# Patient Record
Sex: Male | Born: 1945
Health system: Southern US, Community
[De-identification: ages and names within clinical notes are randomized; demographics above are authoritative.]

## PROBLEM LIST (undated history)

## (undated) DIAGNOSIS — F32A Depression, unspecified: Secondary | ICD-10-CM

## (undated) DIAGNOSIS — K219 Gastro-esophageal reflux disease without esophagitis: Secondary | ICD-10-CM

## (undated) HISTORY — PX: NASAL SEPTUM SURGERY: SHX37

---

## 2000-06-17 ENCOUNTER — Encounter: Admission: RE | Admit: 2000-06-17 | Discharge: 2000-06-17 | Payer: Self-pay | Admitting: Sports Medicine

## 2005-07-28 ENCOUNTER — Ambulatory Visit (HOSPITAL_COMMUNITY): Admission: RE | Admit: 2005-07-28 | Discharge: 2005-07-28 | Payer: Self-pay | Admitting: Gastroenterology

## 2005-07-28 ENCOUNTER — Encounter (INDEPENDENT_AMBULATORY_CARE_PROVIDER_SITE_OTHER): Payer: Self-pay | Admitting: *Deleted

## 2007-10-21 HISTORY — PX: MENISCUS REPAIR: SHX5179

## 2007-11-02 ENCOUNTER — Ambulatory Visit: Payer: Self-pay | Admitting: Sports Medicine

## 2007-11-02 DIAGNOSIS — M25569 Pain in unspecified knee: Secondary | ICD-10-CM | POA: Insufficient documentation

## 2007-11-30 ENCOUNTER — Encounter: Payer: Self-pay | Admitting: Sports Medicine

## 2010-03-07 ENCOUNTER — Emergency Department (HOSPITAL_COMMUNITY): Admission: EM | Admit: 2010-03-07 | Discharge: 2010-03-07 | Payer: Self-pay | Admitting: Emergency Medicine

## 2010-03-13 ENCOUNTER — Encounter: Admission: RE | Admit: 2010-03-13 | Discharge: 2010-03-13 | Payer: Self-pay | Admitting: Allergy and Immunology

## 2011-01-06 LAB — URINE CULTURE: Colony Count: NO GROWTH

## 2011-01-06 LAB — CBC
HCT: 43.4 % (ref 39.0–52.0)
MCV: 92.8 fL (ref 78.0–100.0)

## 2011-01-06 LAB — COMPREHENSIVE METABOLIC PANEL
Albumin: 3.9 g/dL (ref 3.5–5.2)
Chloride: 108 mEq/L (ref 96–112)
Creatinine, Ser: 0.8 mg/dL (ref 0.4–1.5)
GFR calc Af Amer: >60 mL/min (ref >60)
GFR calc non Af Amer: >60 mL/min (ref >60)
Potassium: 4 mEq/L (ref 3.5–5.1)
Total Bilirubin: 1 mg/dL (ref 0.3–1.2)

## 2011-01-06 LAB — URINALYSIS, ROUTINE W REFLEX MICROSCOPIC
Glucose, UA: NEGATIVE mg/dL
Ketones, ur: 15 mg/dL — AB
Nitrite: NEGATIVE
Urobilinogen, UA: 0.2 mg/dL (ref 0.0–1.0)
pH: 8 (ref 5.0–8.0)

## 2011-01-06 LAB — POCT CARDIAC MARKERS
CKMB, poc: 1.2 ng/mL (ref 1.0–8.0)
Myoglobin, poc: 73.9 ng/mL (ref 12–200)
Troponin i, poc: <0.05 ng/mL (ref 0.00–0.09)

## 2011-01-06 LAB — DIFFERENTIAL
Basophils Absolute: 0 10*3/uL (ref 0.0–0.1)
Lymphs Abs: 0.6 10*3/uL — ABNORMAL LOW (ref 0.7–4.0)
Monocytes Absolute: 0.2 10*3/uL (ref 0.1–1.0)
Monocytes Relative: 2 % — ABNORMAL LOW (ref 3–12)
Neutro Abs: 8.7 10*3/uL — ABNORMAL HIGH (ref 1.7–7.7)

## 2011-01-06 LAB — URINE MICROSCOPIC-ADD ON

## 2011-03-07 NOTE — Op Note (Signed)
NAMEARDITH, LEWMAN          ACCOUNT NO.:  [id number]   MEDICAL RECORD NO.:  [id number]          PATIENT TYPE:  AMB   LOCATION:  ENDO                         FACILITY:  [facility]   PHYSICIAN:  Petra Kuba, M.D.    DATE OF BIRTH:  [birth year]   DATE OF PROCEDURE:  07/28/2005  DATE OF DISCHARGE:                                 OPERATIVE REPORT   PROCEDURE:  Colonoscopy.   INDICATIONS:  Screening. Consent was signed after risks, benefits, methods,  options thoroughly discussed with my nurse in the office.   MEDICINES USED:  Fentanyl 75, Versed 7.5.   PROCEDURE:  Rectal inspection is pertinent for external hemorrhoids, small  digital exam was negative. The pediatric adjustable colonoscope was inserted  and easily advanced around the colon to the cecum. On insertion in the mid  transverse, a small polyp seen and was hot biopsied times one.  Another one  in the hepatic flexure was seen and hot biopsied x one and they were put in  the same container. No other abnormalities were seen as we advanced to the  cecum. No position changes or abdominal pressure was needed. Scope was  inserted a short ways in the terminal ileum which was normal .  Photodocumentation was obtained. Scope was slowly withdrawn. Prep was  adequate. There was some liquid stool that required washing and suction on  slow withdrawal through the colon.  The two polyps seen on insertion may  have had a little bit of residual polypoid tissue and a second hot biopsy  was done.  Two other transverse polyps were seen, tiny and hot biopsied x  one or two and put in the same container.  As scope was withdrawn around the  left side of the colon, two tiny descending and sigmoid polyps were seen and  hot biopsied and one smaller one was seen, snared electrocautery applied and  polyp suctioned through the scope and collected in the trap. The three left-  sided colon polyps were put in the separate container. No other  abnormalities were seen as we slowly withdrew back to the rectum.  Anorectal  pull-through and retroflexion confirmed some small hemorrhoids. Scope  straightened readvanced a short ways up the left side of the colon. Air was  suctioned, scope removed. The patient tolerated the procedure well. There  was no obvious immediate complication.   ENDOSCOPIC DIAGNOSIS:  1.  Internal-external small hemorrhoids.  2.  One small snare in the sigmoid.  3.  Six hot biopsies, one in the sigmoid and one in the descending, one in      the hepatic flexure, and three in the transverse.  4.  Otherwise within normal limits to the terminal ileum.   PLAN:  Await pathology to determine future colonic screening. Happy to see  back p.r.n. Otherwise return care to Dr. Janey Greaser for the customary health  care maintenance to include yearly rectals and guaiacs.           ______________________________  Petra Kuba, M.D.     MEM/MEDQ  D:  07/28/2005  T:  07/28/2005  Job:  [id number]   cc:  Al Decant. Janey Greaser, MD  Fax: [phone]

## 2011-07-02 IMAGING — CT CT ABD-PELV W/ CM
1 of 3 series · 14 of 32 positions shown, 19 images · IV contrast (agent unspecified)
Comparison: None.

CLINICAL DATA: Abdominal pain, nausea, chills

CT ABDOMEN AND PELVIS WITH CONTRAST
TECHNIQUE: Multidetector CT imaging of the abdomen and pelvis was
performed following the standard protocol during bolus
administration of intravenous contrast.
Contrast: 100 ml Pmnipaque-WDD

[Series 2: rtn ap with st · axial · 0.74mm/px · z∈[-468,-43]mm · 14 of 96 slices shown, 19 images]
[im 6/96  soft-tissue]
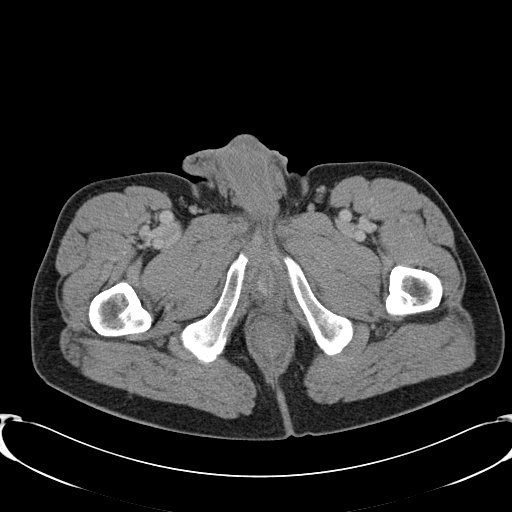
[im 6/96  bone]
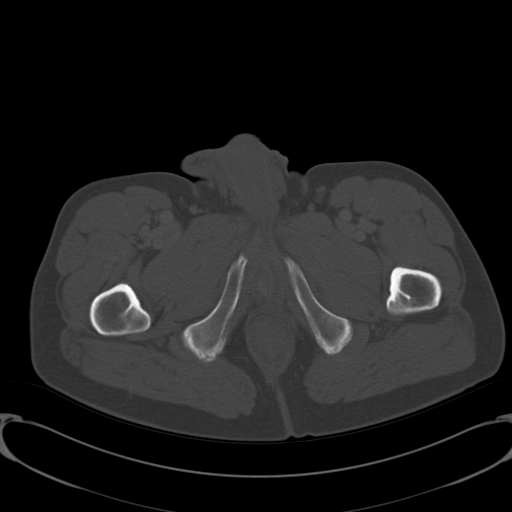
[im 16/96  soft-tissue]
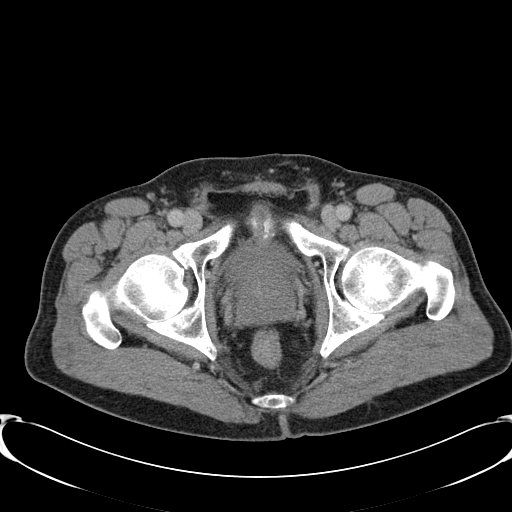
[im 21/96  soft-tissue]
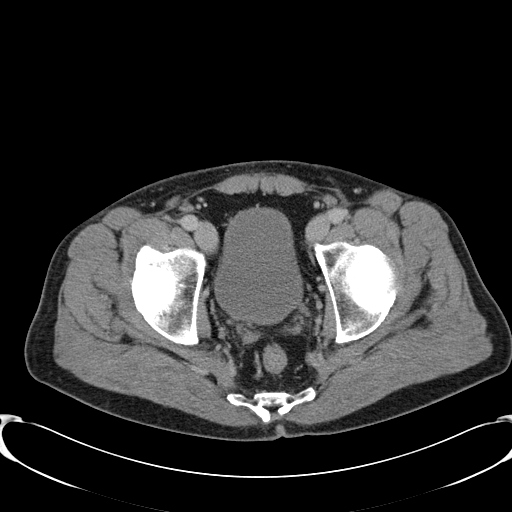
[im 26/96  soft-tissue]
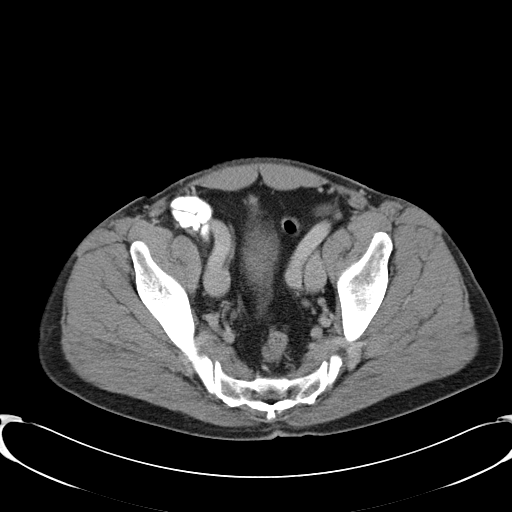
[im 36/96  soft-tissue]
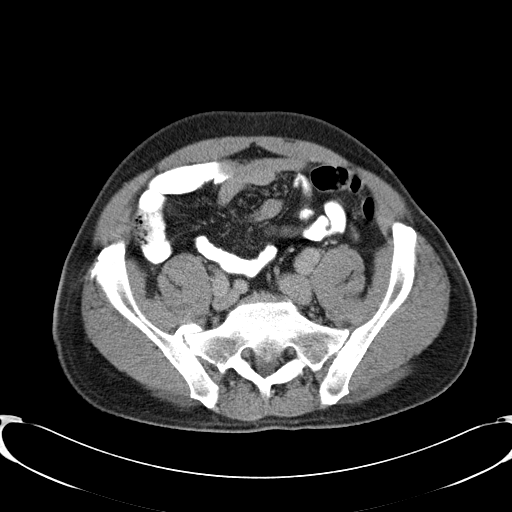
[im 41/96  soft-tissue]
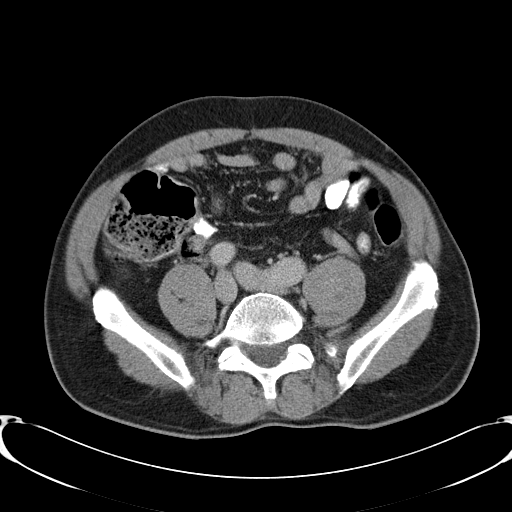
[im 51/96  soft-tissue]
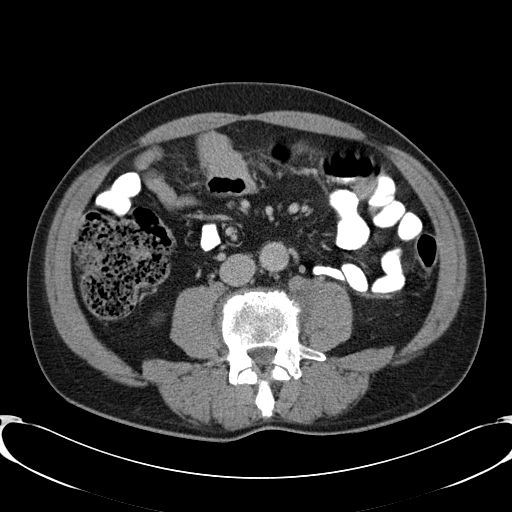
[im 56/96  soft-tissue]
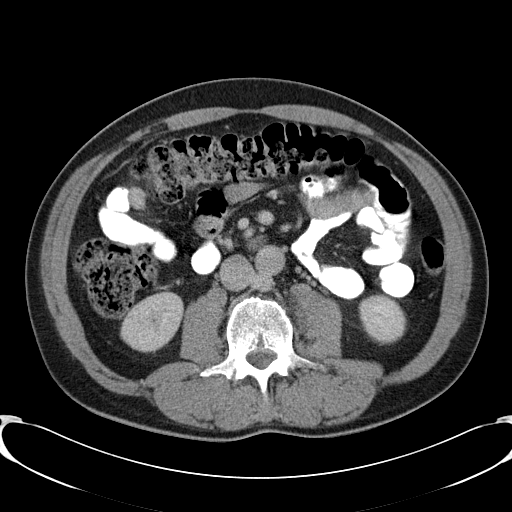
[im 61/96  soft-tissue]
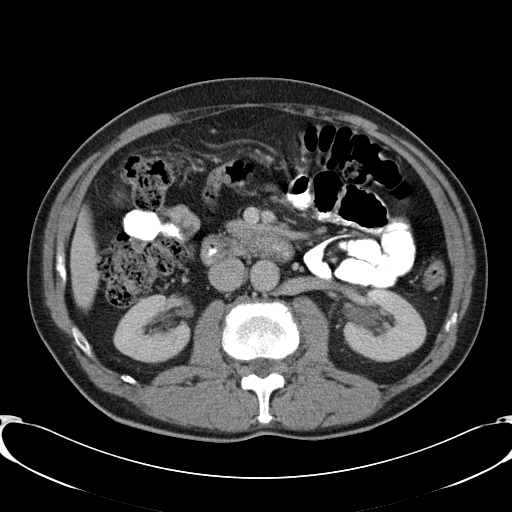
[im 61/96  bone]
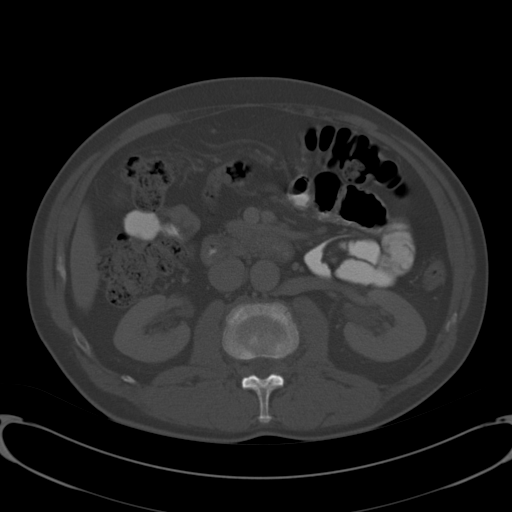
[im 71/96  soft-tissue]
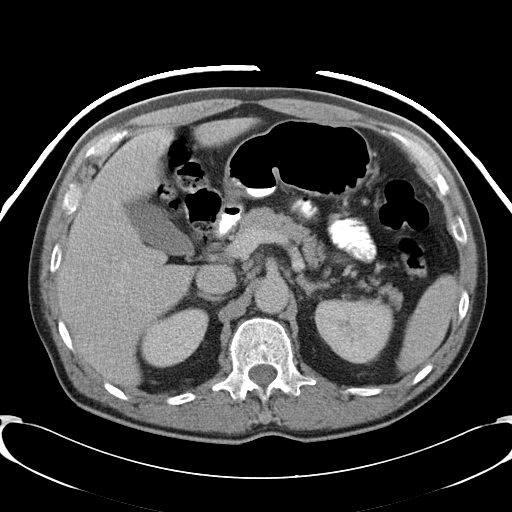
[im 76/96  soft-tissue]
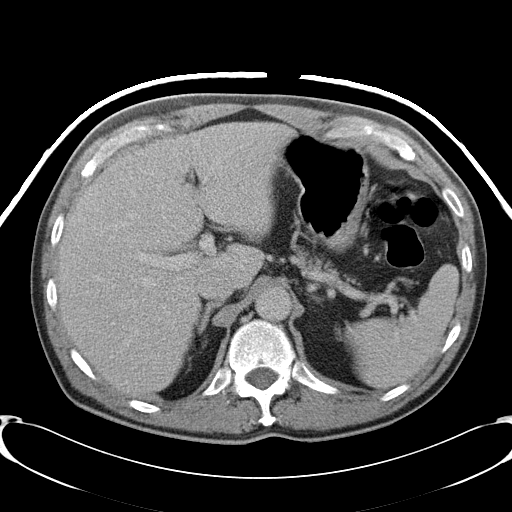
[im 76/96  lung]
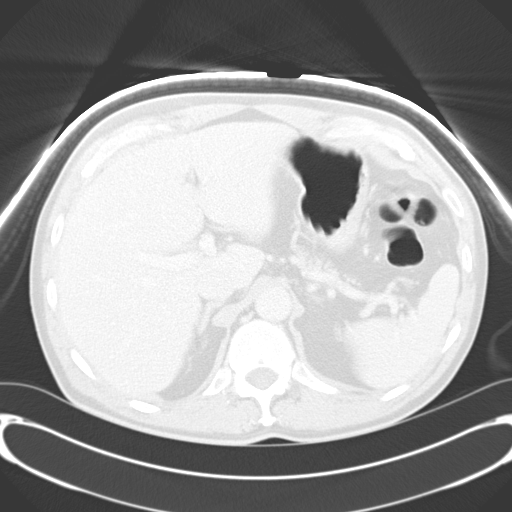
[im 81/96  soft-tissue]
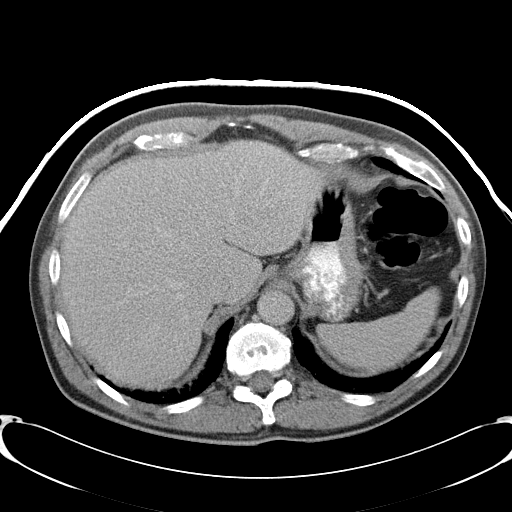
[im 81/96  lung]
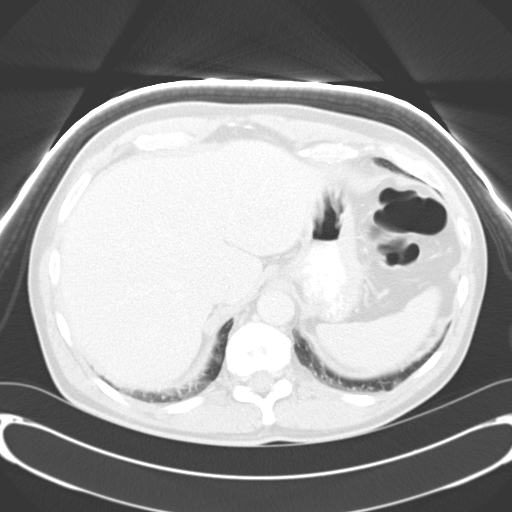
[im 86/96  lung]
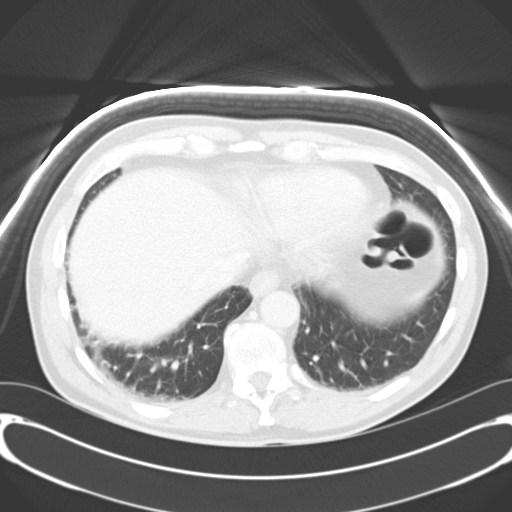
[im 91/96  soft-tissue]
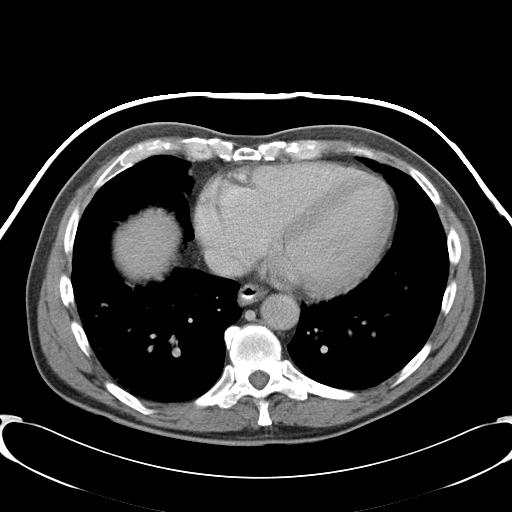
[im 91/96  lung]
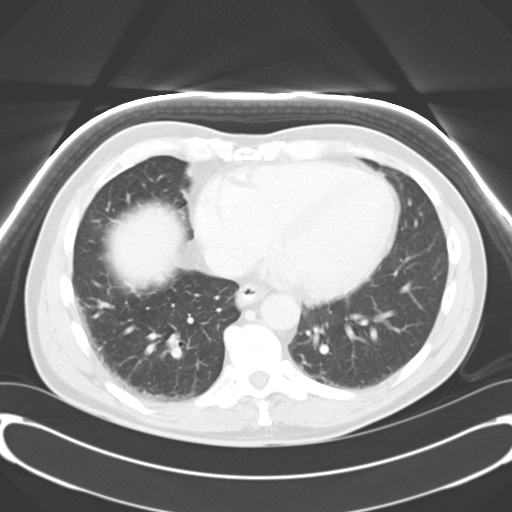

[14 of 32 positions shown; findings below may reference images not displayed]

FINDINGS: The lung bases are clear.  The liver enhances with no
focal abnormality and no ductal dilatation is seen.  No calcified
gallstones are noted.  The pancreas is normal in size and the
pancreatic duct is not dilated.  The adrenal glands and spleen are
unremarkable.  The stomach is moderately distended and is
unremarkable.  The kidneys enhance and there is a cyst emanating
from the posterior right lower kidney.  Left parapelvic cysts are
noted.  No hydronephrosis is seen on delayed images.  The abdominal
aorta is normal in caliber.  No adenopathy is seen.

The terminal ileum is unremarkable.  The appendix is well seen in
the right lower quadrant and there is no evidence of appendicitis.
The urinary bladder is unremarkable.  The prostate is slightly
prominent.  No free fluid is seen within the pelvis.  No bony
abnormality is seen.
IMPRESSION: 1.  No significant abnormality on CT of the abdomen and pelvis.
2. The appendix is well seen and appears normal.  No hydronephrosis
is seen.

## 2012-01-01 ENCOUNTER — Other Ambulatory Visit: Payer: Self-pay | Admitting: Gastroenterology

## 2014-08-09 DIAGNOSIS — E559 Vitamin D deficiency, unspecified: Secondary | ICD-10-CM | POA: Diagnosis not present

## 2014-08-09 DIAGNOSIS — Z6827 Body mass index (BMI) 27.0-27.9, adult: Secondary | ICD-10-CM | POA: Diagnosis not present

## 2014-08-09 DIAGNOSIS — R5383 Other fatigue: Secondary | ICD-10-CM | POA: Diagnosis not present

## 2014-08-09 DIAGNOSIS — Z23 Encounter for immunization: Secondary | ICD-10-CM | POA: Diagnosis not present

## 2015-02-05 DIAGNOSIS — Z125 Encounter for screening for malignant neoplasm of prostate: Secondary | ICD-10-CM | POA: Diagnosis not present

## 2015-02-05 DIAGNOSIS — E559 Vitamin D deficiency, unspecified: Secondary | ICD-10-CM | POA: Diagnosis not present

## 2015-02-05 DIAGNOSIS — R5383 Other fatigue: Secondary | ICD-10-CM | POA: Diagnosis not present

## 2015-02-05 DIAGNOSIS — Z79899 Other long term (current) drug therapy: Secondary | ICD-10-CM | POA: Diagnosis not present

## 2015-02-12 DIAGNOSIS — D509 Iron deficiency anemia, unspecified: Secondary | ICD-10-CM | POA: Diagnosis not present

## 2015-02-12 DIAGNOSIS — N400 Enlarged prostate without lower urinary tract symptoms: Secondary | ICD-10-CM | POA: Diagnosis not present

## 2015-02-12 DIAGNOSIS — Z862 Personal history of diseases of the blood and blood-forming organs and certain disorders involving the immune mechanism: Secondary | ICD-10-CM | POA: Diagnosis not present

## 2015-02-12 DIAGNOSIS — R5383 Other fatigue: Secondary | ICD-10-CM | POA: Diagnosis not present

## 2015-02-12 DIAGNOSIS — E559 Vitamin D deficiency, unspecified: Secondary | ICD-10-CM | POA: Diagnosis not present

## 2015-02-12 DIAGNOSIS — F5221 Male erectile disorder: Secondary | ICD-10-CM | POA: Diagnosis not present

## 2015-02-12 DIAGNOSIS — M1990 Unspecified osteoarthritis, unspecified site: Secondary | ICD-10-CM | POA: Diagnosis not present

## 2015-02-12 DIAGNOSIS — Z6827 Body mass index (BMI) 27.0-27.9, adult: Secondary | ICD-10-CM | POA: Diagnosis not present

## 2015-02-12 DIAGNOSIS — E785 Hyperlipidemia, unspecified: Secondary | ICD-10-CM | POA: Diagnosis not present

## 2015-02-12 DIAGNOSIS — Z23 Encounter for immunization: Secondary | ICD-10-CM | POA: Diagnosis not present

## 2015-02-12 DIAGNOSIS — Z0000 Encounter for general adult medical examination without abnormal findings: Secondary | ICD-10-CM | POA: Diagnosis not present

## 2015-02-12 DIAGNOSIS — R197 Diarrhea, unspecified: Secondary | ICD-10-CM | POA: Diagnosis not present

## 2015-02-13 DIAGNOSIS — Z1212 Encounter for screening for malignant neoplasm of rectum: Secondary | ICD-10-CM | POA: Diagnosis not present

## 2015-02-19 ENCOUNTER — Other Ambulatory Visit (HOSPITAL_COMMUNITY): Payer: Self-pay | Admitting: *Deleted

## 2015-02-20 ENCOUNTER — Ambulatory Visit (HOSPITAL_COMMUNITY)
Admission: RE | Admit: 2015-02-20 | Discharge: 2015-02-20 | Disposition: A | Discharge status: Home / Self Care | Payer: Medicare Other | Source: Ambulatory Visit | Attending: Internal Medicine | Admitting: Internal Medicine

## 2015-02-20 DIAGNOSIS — D649 Anemia, unspecified: Secondary | ICD-10-CM | POA: Insufficient documentation

## 2015-02-20 MED ORDER — SODIUM CHLORIDE 0.9 % IV SOLN
510.0000 mg | INTRAVENOUS | Status: DC
  Administered 2015-02-20: 510 mg via INTRAVENOUS
  Filled 2015-02-20: qty 17

## 2015-02-20 NOTE — Discharge Instructions (Signed)

## 2015-02-26 ENCOUNTER — Encounter (HOSPITAL_COMMUNITY)
Admission: RE | Admit: 2015-02-26 | Discharge: 2015-02-26 | Disposition: A | Discharge status: Home / Self Care | Payer: Medicare Other | Source: Ambulatory Visit | Attending: Internal Medicine | Admitting: Internal Medicine

## 2015-02-26 DIAGNOSIS — D649 Anemia, unspecified: Secondary | ICD-10-CM | POA: Diagnosis not present

## 2015-02-26 MED ORDER — SODIUM CHLORIDE 0.9 % IV SOLN
510.0000 mg | INTRAVENOUS | Status: DC
  Administered 2015-02-26: 510 mg via INTRAVENOUS
  Filled 2015-02-26: qty 17

## 2015-02-27 DIAGNOSIS — D649 Anemia, unspecified: Secondary | ICD-10-CM | POA: Diagnosis not present

## 2015-04-11 DIAGNOSIS — H25099 Other age-related incipient cataract, unspecified eye: Secondary | ICD-10-CM | POA: Diagnosis not present

## 2015-04-16 DIAGNOSIS — Z6827 Body mass index (BMI) 27.0-27.9, adult: Secondary | ICD-10-CM | POA: Diagnosis not present

## 2015-04-16 DIAGNOSIS — D509 Iron deficiency anemia, unspecified: Secondary | ICD-10-CM | POA: Diagnosis not present

## 2015-04-16 DIAGNOSIS — R197 Diarrhea, unspecified: Secondary | ICD-10-CM | POA: Diagnosis not present

## 2015-04-19 DIAGNOSIS — J45990 Exercise induced bronchospasm: Secondary | ICD-10-CM | POA: Diagnosis not present

## 2015-04-19 DIAGNOSIS — K219 Gastro-esophageal reflux disease without esophagitis: Secondary | ICD-10-CM | POA: Diagnosis not present

## 2015-04-19 DIAGNOSIS — J300 Vasomotor rhinitis: Secondary | ICD-10-CM | POA: Diagnosis not present

## 2015-04-20 DIAGNOSIS — N5201 Erectile dysfunction due to arterial insufficiency: Secondary | ICD-10-CM | POA: Diagnosis not present

## 2015-04-20 DIAGNOSIS — N138 Other obstructive and reflux uropathy: Secondary | ICD-10-CM | POA: Diagnosis not present

## 2015-04-20 DIAGNOSIS — R3912 Poor urinary stream: Secondary | ICD-10-CM | POA: Diagnosis not present

## 2015-04-20 DIAGNOSIS — N401 Enlarged prostate with lower urinary tract symptoms: Secondary | ICD-10-CM | POA: Diagnosis not present

## 2016-02-11 DIAGNOSIS — Z125 Encounter for screening for malignant neoplasm of prostate: Secondary | ICD-10-CM | POA: Diagnosis not present

## 2016-02-11 DIAGNOSIS — E559 Vitamin D deficiency, unspecified: Secondary | ICD-10-CM | POA: Diagnosis not present

## 2016-02-11 DIAGNOSIS — E784 Other hyperlipidemia: Secondary | ICD-10-CM | POA: Diagnosis not present

## 2016-02-18 DIAGNOSIS — K219 Gastro-esophageal reflux disease without esophagitis: Secondary | ICD-10-CM | POA: Diagnosis not present

## 2016-02-18 DIAGNOSIS — E784 Other hyperlipidemia: Secondary | ICD-10-CM | POA: Diagnosis not present

## 2016-02-18 DIAGNOSIS — E559 Vitamin D deficiency, unspecified: Secondary | ICD-10-CM | POA: Diagnosis not present

## 2016-02-18 DIAGNOSIS — Z0000 Encounter for general adult medical examination without abnormal findings: Secondary | ICD-10-CM | POA: Diagnosis not present

## 2016-02-18 DIAGNOSIS — F419 Anxiety disorder, unspecified: Secondary | ICD-10-CM | POA: Diagnosis not present

## 2016-02-18 DIAGNOSIS — Z6827 Body mass index (BMI) 27.0-27.9, adult: Secondary | ICD-10-CM | POA: Diagnosis not present

## 2016-02-18 DIAGNOSIS — N400 Enlarged prostate without lower urinary tract symptoms: Secondary | ICD-10-CM | POA: Diagnosis not present

## 2016-02-18 DIAGNOSIS — Z1389 Encounter for screening for other disorder: Secondary | ICD-10-CM | POA: Diagnosis not present

## 2016-02-18 DIAGNOSIS — R5383 Other fatigue: Secondary | ICD-10-CM | POA: Diagnosis not present

## 2016-02-18 DIAGNOSIS — D509 Iron deficiency anemia, unspecified: Secondary | ICD-10-CM | POA: Diagnosis not present

## 2016-02-18 DIAGNOSIS — F5221 Male erectile disorder: Secondary | ICD-10-CM | POA: Diagnosis not present

## 2016-02-29 DIAGNOSIS — H10022 Other mucopurulent conjunctivitis, left eye: Secondary | ICD-10-CM | POA: Diagnosis not present

## 2016-02-29 DIAGNOSIS — K219 Gastro-esophageal reflux disease without esophagitis: Secondary | ICD-10-CM | POA: Diagnosis not present

## 2016-02-29 DIAGNOSIS — J45990 Exercise induced bronchospasm: Secondary | ICD-10-CM | POA: Diagnosis not present

## 2016-02-29 DIAGNOSIS — J300 Vasomotor rhinitis: Secondary | ICD-10-CM | POA: Diagnosis not present

## 2016-04-14 ENCOUNTER — Ambulatory Visit
Admission: RE | Admit: 2016-04-14 | Discharge: 2016-04-14 | Disposition: A | Discharge status: Home / Self Care | Payer: BC Managed Care – PPO | Source: Ambulatory Visit | Attending: Allergy and Immunology | Admitting: Allergy and Immunology

## 2016-04-14 ENCOUNTER — Other Ambulatory Visit: Payer: Self-pay | Admitting: Allergy and Immunology

## 2016-04-14 DIAGNOSIS — R05 Cough: Secondary | ICD-10-CM

## 2016-04-14 DIAGNOSIS — J45990 Exercise induced bronchospasm: Secondary | ICD-10-CM | POA: Diagnosis not present

## 2016-04-14 DIAGNOSIS — R059 Cough, unspecified: Secondary | ICD-10-CM

## 2016-04-14 DIAGNOSIS — K219 Gastro-esophageal reflux disease without esophagitis: Secondary | ICD-10-CM | POA: Diagnosis not present

## 2016-04-14 DIAGNOSIS — J300 Vasomotor rhinitis: Secondary | ICD-10-CM | POA: Diagnosis not present

## 2016-05-01 DIAGNOSIS — H25099 Other age-related incipient cataract, unspecified eye: Secondary | ICD-10-CM | POA: Diagnosis not present

## 2016-06-11 DIAGNOSIS — N401 Enlarged prostate with lower urinary tract symptoms: Secondary | ICD-10-CM | POA: Diagnosis not present

## 2016-06-11 DIAGNOSIS — K219 Gastro-esophageal reflux disease without esophagitis: Secondary | ICD-10-CM | POA: Diagnosis not present

## 2016-06-11 DIAGNOSIS — J45990 Exercise induced bronchospasm: Secondary | ICD-10-CM | POA: Diagnosis not present

## 2016-06-11 DIAGNOSIS — J300 Vasomotor rhinitis: Secondary | ICD-10-CM | POA: Diagnosis not present

## 2016-06-18 DIAGNOSIS — R351 Nocturia: Secondary | ICD-10-CM | POA: Diagnosis not present

## 2016-06-18 DIAGNOSIS — N5201 Erectile dysfunction due to arterial insufficiency: Secondary | ICD-10-CM | POA: Diagnosis not present

## 2016-06-18 DIAGNOSIS — R3914 Feeling of incomplete bladder emptying: Secondary | ICD-10-CM | POA: Diagnosis not present

## 2016-06-18 DIAGNOSIS — N401 Enlarged prostate with lower urinary tract symptoms: Secondary | ICD-10-CM | POA: Diagnosis not present

## 2016-07-23 DIAGNOSIS — Z23 Encounter for immunization: Secondary | ICD-10-CM | POA: Diagnosis not present

## 2016-12-18 DIAGNOSIS — J300 Vasomotor rhinitis: Secondary | ICD-10-CM | POA: Diagnosis not present

## 2016-12-18 DIAGNOSIS — J45990 Exercise induced bronchospasm: Secondary | ICD-10-CM | POA: Diagnosis not present

## 2016-12-18 DIAGNOSIS — K219 Gastro-esophageal reflux disease without esophagitis: Secondary | ICD-10-CM | POA: Diagnosis not present

## 2017-01-28 DIAGNOSIS — L570 Actinic keratosis: Secondary | ICD-10-CM | POA: Diagnosis not present

## 2017-01-28 DIAGNOSIS — L298 Other pruritus: Secondary | ICD-10-CM | POA: Diagnosis not present

## 2017-02-12 DIAGNOSIS — Z125 Encounter for screening for malignant neoplasm of prostate: Secondary | ICD-10-CM | POA: Diagnosis not present

## 2017-02-12 DIAGNOSIS — D508 Other iron deficiency anemias: Secondary | ICD-10-CM | POA: Diagnosis not present

## 2017-02-12 DIAGNOSIS — E559 Vitamin D deficiency, unspecified: Secondary | ICD-10-CM | POA: Diagnosis not present

## 2017-02-12 DIAGNOSIS — E784 Other hyperlipidemia: Secondary | ICD-10-CM | POA: Diagnosis not present

## 2017-02-24 DIAGNOSIS — K219 Gastro-esophageal reflux disease without esophagitis: Secondary | ICD-10-CM | POA: Diagnosis not present

## 2017-02-24 DIAGNOSIS — Z0000 Encounter for general adult medical examination without abnormal findings: Secondary | ICD-10-CM | POA: Diagnosis not present

## 2017-02-24 DIAGNOSIS — J300 Vasomotor rhinitis: Secondary | ICD-10-CM | POA: Diagnosis not present

## 2017-02-24 DIAGNOSIS — Z1389 Encounter for screening for other disorder: Secondary | ICD-10-CM | POA: Diagnosis not present

## 2017-02-24 DIAGNOSIS — D508 Other iron deficiency anemias: Secondary | ICD-10-CM | POA: Diagnosis not present

## 2017-02-24 DIAGNOSIS — Z6828 Body mass index (BMI) 28.0-28.9, adult: Secondary | ICD-10-CM | POA: Diagnosis not present

## 2017-02-24 DIAGNOSIS — J45990 Exercise induced bronchospasm: Secondary | ICD-10-CM | POA: Diagnosis not present

## 2017-02-24 DIAGNOSIS — N400 Enlarged prostate without lower urinary tract symptoms: Secondary | ICD-10-CM | POA: Diagnosis not present

## 2017-02-24 DIAGNOSIS — E559 Vitamin D deficiency, unspecified: Secondary | ICD-10-CM | POA: Diagnosis not present

## 2017-02-24 DIAGNOSIS — R0609 Other forms of dyspnea: Secondary | ICD-10-CM | POA: Diagnosis not present

## 2017-02-24 DIAGNOSIS — E784 Other hyperlipidemia: Secondary | ICD-10-CM | POA: Diagnosis not present

## 2017-02-24 DIAGNOSIS — D509 Iron deficiency anemia, unspecified: Secondary | ICD-10-CM | POA: Diagnosis not present

## 2017-02-24 DIAGNOSIS — F418 Other specified anxiety disorders: Secondary | ICD-10-CM | POA: Diagnosis not present

## 2017-03-02 DIAGNOSIS — Z1212 Encounter for screening for malignant neoplasm of rectum: Secondary | ICD-10-CM | POA: Diagnosis not present

## 2017-03-03 DIAGNOSIS — D500 Iron deficiency anemia secondary to blood loss (chronic): Secondary | ICD-10-CM | POA: Diagnosis not present

## 2017-03-03 DIAGNOSIS — Z86010 Personal history of colonic polyps: Secondary | ICD-10-CM | POA: Diagnosis not present

## 2017-03-03 DIAGNOSIS — K921 Melena: Secondary | ICD-10-CM | POA: Diagnosis not present

## 2017-03-05 ENCOUNTER — Other Ambulatory Visit (HOSPITAL_COMMUNITY): Payer: Self-pay | Admitting: *Deleted

## 2017-03-06 ENCOUNTER — Encounter (HOSPITAL_COMMUNITY)
Admission: RE | Admit: 2017-03-06 | Discharge: 2017-03-06 | Disposition: A | Discharge status: Home / Self Care | Payer: Medicare Other | Source: Ambulatory Visit | Attending: Internal Medicine | Admitting: Internal Medicine

## 2017-03-06 DIAGNOSIS — D649 Anemia, unspecified: Secondary | ICD-10-CM | POA: Insufficient documentation

## 2017-03-06 MED ORDER — SODIUM CHLORIDE 0.9 % IV SOLN
510.0000 mg | INTRAVENOUS | Status: DC
  Administered 2017-03-06: 510 mg via INTRAVENOUS
  Filled 2017-03-06: qty 17

## 2017-03-12 ENCOUNTER — Other Ambulatory Visit (HOSPITAL_COMMUNITY): Payer: Self-pay | Admitting: *Deleted

## 2017-03-13 ENCOUNTER — Ambulatory Visit (HOSPITAL_COMMUNITY)
Admission: RE | Admit: 2017-03-13 | Discharge: 2017-03-13 | Disposition: A | Discharge status: Home / Self Care | Payer: Medicare Other | Source: Ambulatory Visit | Attending: Internal Medicine | Admitting: Internal Medicine

## 2017-03-13 DIAGNOSIS — D649 Anemia, unspecified: Secondary | ICD-10-CM | POA: Insufficient documentation

## 2017-03-13 MED ORDER — SODIUM CHLORIDE 0.9 % IV SOLN
510.0000 mg | INTRAVENOUS | Status: AC
  Administered 2017-03-13: 510 mg via INTRAVENOUS
  Filled 2017-03-13: qty 17

## 2017-03-18 DIAGNOSIS — K317 Polyp of stomach and duodenum: Secondary | ICD-10-CM | POA: Diagnosis not present

## 2017-03-18 DIAGNOSIS — K449 Diaphragmatic hernia without obstruction or gangrene: Secondary | ICD-10-CM | POA: Diagnosis not present

## 2017-03-18 DIAGNOSIS — D509 Iron deficiency anemia, unspecified: Secondary | ICD-10-CM | POA: Diagnosis not present

## 2017-03-18 DIAGNOSIS — D126 Benign neoplasm of colon, unspecified: Secondary | ICD-10-CM | POA: Diagnosis not present

## 2017-03-18 DIAGNOSIS — K621 Rectal polyp: Secondary | ICD-10-CM | POA: Diagnosis not present

## 2017-03-18 DIAGNOSIS — K635 Polyp of colon: Secondary | ICD-10-CM | POA: Diagnosis not present

## 2017-03-18 DIAGNOSIS — Z86010 Personal history of colonic polyps: Secondary | ICD-10-CM | POA: Diagnosis not present

## 2017-05-07 DIAGNOSIS — H25099 Other age-related incipient cataract, unspecified eye: Secondary | ICD-10-CM | POA: Diagnosis not present

## 2017-06-17 DIAGNOSIS — D225 Melanocytic nevi of trunk: Secondary | ICD-10-CM | POA: Diagnosis not present

## 2017-06-17 DIAGNOSIS — D1801 Hemangioma of skin and subcutaneous tissue: Secondary | ICD-10-CM | POA: Diagnosis not present

## 2017-06-17 DIAGNOSIS — D2262 Melanocytic nevi of left upper limb, including shoulder: Secondary | ICD-10-CM | POA: Diagnosis not present

## 2017-06-17 DIAGNOSIS — L570 Actinic keratosis: Secondary | ICD-10-CM | POA: Diagnosis not present

## 2017-06-17 DIAGNOSIS — L821 Other seborrheic keratosis: Secondary | ICD-10-CM | POA: Diagnosis not present

## 2017-06-24 DIAGNOSIS — N5201 Erectile dysfunction due to arterial insufficiency: Secondary | ICD-10-CM | POA: Diagnosis not present

## 2017-06-24 DIAGNOSIS — R351 Nocturia: Secondary | ICD-10-CM | POA: Diagnosis not present

## 2017-06-24 DIAGNOSIS — R3914 Feeling of incomplete bladder emptying: Secondary | ICD-10-CM | POA: Diagnosis not present

## 2017-06-24 DIAGNOSIS — N401 Enlarged prostate with lower urinary tract symptoms: Secondary | ICD-10-CM | POA: Diagnosis not present

## 2017-07-21 DIAGNOSIS — Z23 Encounter for immunization: Secondary | ICD-10-CM | POA: Diagnosis not present

## 2017-12-23 DIAGNOSIS — J45990 Exercise induced bronchospasm: Secondary | ICD-10-CM | POA: Diagnosis not present

## 2017-12-23 DIAGNOSIS — K219 Gastro-esophageal reflux disease without esophagitis: Secondary | ICD-10-CM | POA: Diagnosis not present

## 2017-12-23 DIAGNOSIS — J300 Vasomotor rhinitis: Secondary | ICD-10-CM | POA: Diagnosis not present

## 2018-02-22 DIAGNOSIS — E7849 Other hyperlipidemia: Secondary | ICD-10-CM | POA: Diagnosis not present

## 2018-02-22 DIAGNOSIS — R82998 Other abnormal findings in urine: Secondary | ICD-10-CM | POA: Diagnosis not present

## 2018-02-22 DIAGNOSIS — E559 Vitamin D deficiency, unspecified: Secondary | ICD-10-CM | POA: Diagnosis not present

## 2018-02-22 DIAGNOSIS — Z125 Encounter for screening for malignant neoplasm of prostate: Secondary | ICD-10-CM | POA: Diagnosis not present

## 2018-03-01 DIAGNOSIS — L84 Corns and callosities: Secondary | ICD-10-CM | POA: Diagnosis not present

## 2018-03-01 DIAGNOSIS — E7849 Other hyperlipidemia: Secondary | ICD-10-CM | POA: Diagnosis not present

## 2018-03-01 DIAGNOSIS — D649 Anemia, unspecified: Secondary | ICD-10-CM | POA: Diagnosis not present

## 2018-03-01 DIAGNOSIS — Z0000 Encounter for general adult medical examination without abnormal findings: Secondary | ICD-10-CM | POA: Diagnosis not present

## 2018-03-01 DIAGNOSIS — Z1389 Encounter for screening for other disorder: Secondary | ICD-10-CM | POA: Diagnosis not present

## 2018-03-01 DIAGNOSIS — K219 Gastro-esophageal reflux disease without esophagitis: Secondary | ICD-10-CM | POA: Diagnosis not present

## 2018-03-01 DIAGNOSIS — D508 Other iron deficiency anemias: Secondary | ICD-10-CM | POA: Diagnosis not present

## 2018-03-01 DIAGNOSIS — F5221 Male erectile disorder: Secondary | ICD-10-CM | POA: Diagnosis not present

## 2018-03-01 DIAGNOSIS — Z6826 Body mass index (BMI) 26.0-26.9, adult: Secondary | ICD-10-CM | POA: Diagnosis not present

## 2018-03-01 DIAGNOSIS — N400 Enlarged prostate without lower urinary tract symptoms: Secondary | ICD-10-CM | POA: Diagnosis not present

## 2018-03-01 DIAGNOSIS — E559 Vitamin D deficiency, unspecified: Secondary | ICD-10-CM | POA: Diagnosis not present

## 2018-03-04 DIAGNOSIS — Z1212 Encounter for screening for malignant neoplasm of rectum: Secondary | ICD-10-CM | POA: Diagnosis not present

## 2018-03-08 ENCOUNTER — Ambulatory Visit (INDEPENDENT_AMBULATORY_CARE_PROVIDER_SITE_OTHER): Payer: Medicare Other

## 2018-03-08 ENCOUNTER — Encounter: Payer: Self-pay | Admitting: Podiatry

## 2018-03-08 ENCOUNTER — Ambulatory Visit (INDEPENDENT_AMBULATORY_CARE_PROVIDER_SITE_OTHER): Payer: Medicare Other | Admitting: Podiatry

## 2018-03-08 DIAGNOSIS — M2041 Other hammer toe(s) (acquired), right foot: Secondary | ICD-10-CM | POA: Diagnosis not present

## 2018-03-08 DIAGNOSIS — M205X2 Other deformities of toe(s) (acquired), left foot: Secondary | ICD-10-CM

## 2018-03-08 DIAGNOSIS — M21619 Bunion of unspecified foot: Secondary | ICD-10-CM

## 2018-03-08 DIAGNOSIS — L989 Disorder of the skin and subcutaneous tissue, unspecified: Secondary | ICD-10-CM

## 2018-03-08 NOTE — Progress Notes (Signed)
Subjective:    Patient ID: James Chavez, male    DOB: [birth year], 72 y.o.   MRN: [id number]  HPI 72 year old male presents the office today with concerns of pain in the right fifth toe is been ongoing for last couple weeks.  He has noticed is been somewhat red to the toe he has had a callus or some thickened skin overlying the area.  He did see his primary care physician who states that he did not look infected want me to take a look at it.  Denies any recent injury or trauma.  He also states he has some pain to the left bunion which started become painful.  Again there is no recent injury that he recalls.  He said no recent treatment for either of these issues.  No other concerns.   Review of Systems  All other systems reviewed and are negative.  History reviewed. No pertinent past medical history.  History reviewed. No pertinent surgical history.   Current Outpatient Medications:  .  CIALIS 5 MG tablet, Take 5 mg by mouth daily., Disp: , Rfl: 4 .  DEXILANT 60 MG capsule, Take 60 mg by mouth daily., Disp: , Rfl: 5 .  DYMISTA 137-50 MCG/ACT SUSP, Place 1 spray into both nostrils 2 (two) times daily., Disp: , Rfl: 5 .  FLUoxetine (PROZAC) 20 MG capsule, Take 20 mg by mouth daily., Disp: , Rfl: 1 .  QVAR REDIHALER 80 MCG/ACT inhaler, , Disp: , Rfl:  .  silodosin (RAPAFLO) 8 MG CAPS capsule, Take by mouth daily., Disp: , Rfl: 11  No Known Allergies       Objective:   Physical Exam General: AAO x3, NAD  Dermatological: Hyperkeratotic lesion of the dorsal lateral aspect of the right fifth toe.  Upon debridement there is no underlying ulceration, drainage or any signs of infection.  There is slight erythema to the area around the callus but this is more from irritation inside shoes as opposed to infection.  There is no drainage or pus coming to the area and there is no increase in warmth there is no ascending cellulitis.  No other open lesions are identified or pre-ulcerative  lesions.  Vascular: Dorsalis Pedis artery and Posterior Tibial artery pedal pulses are 2/4 bilateral with immedate capillary fill time.  There is no pain with calf compression, swelling, warmth, erythema.   Neruologic: Grossly intact via light touch bilateral.  Protective threshold with Semmes Wienstein monofilament intact to all pedal sites bilateral.   Musculoskeletal: Adductovarus is present as well as hammertoe contractures.  HAV is present on the right foot and there is decreased range of motion of the first MTPJ on the left foot.  At this time there is no tenderness palpation left first MPJ there is no overlying edema, erythema, increase in warmth.  No other areas of tenderness identified.  Muscular strength 5/5 in all groups tested bilateral.  Gait: Unassisted, Nonantalgic.     Assessment & Plan:  72 year old male with a hyperkeratotic lesion right fifth toe and skin erythema likely from inflammation from rubbing inside shoes; hallux limitus left foot -Treatment options discussed including all alternatives, risks, and complications -X-rays were obtained and reviewed with the patient. Hammertoe contractures are present.  Arthritic changes present the first MPJ in the left foot.  Bunion is present in the right foot. -Etiology of symptoms were discussed -I sharply debrided the hyperkeratotic lesion of the right fifth toe without any complications or bleeding.  Offloading pads were dispensed.  Discussed the change in shoes as well.  I think the redness is coming from the toe was rubbing but there is no signs of infection otherwise but however continue to monitor for this.  Also I think to try to change shoes to wear a stiffer soled shoe would be better for the left foot due to the hallux limitus.  We can also consider custom inserts if needed.  At this time there is no pain to hold off on steroid injection.  Trula Slade DPM  -

## 2018-04-15 DIAGNOSIS — Z6826 Body mass index (BMI) 26.0-26.9, adult: Secondary | ICD-10-CM | POA: Diagnosis not present

## 2018-04-15 DIAGNOSIS — J019 Acute sinusitis, unspecified: Secondary | ICD-10-CM | POA: Diagnosis not present

## 2018-04-15 DIAGNOSIS — G252 Other specified forms of tremor: Secondary | ICD-10-CM | POA: Diagnosis not present

## 2018-04-15 DIAGNOSIS — K219 Gastro-esophageal reflux disease without esophagitis: Secondary | ICD-10-CM | POA: Diagnosis not present

## 2018-04-15 DIAGNOSIS — R51 Headache: Secondary | ICD-10-CM | POA: Diagnosis not present

## 2018-04-15 DIAGNOSIS — R5383 Other fatigue: Secondary | ICD-10-CM | POA: Diagnosis not present

## 2018-04-15 DIAGNOSIS — D508 Other iron deficiency anemias: Secondary | ICD-10-CM | POA: Diagnosis not present

## 2018-04-15 DIAGNOSIS — R110 Nausea: Secondary | ICD-10-CM | POA: Diagnosis not present

## 2018-05-12 DIAGNOSIS — H524 Presbyopia: Secondary | ICD-10-CM | POA: Diagnosis not present

## 2018-05-12 DIAGNOSIS — H25099 Other age-related incipient cataract, unspecified eye: Secondary | ICD-10-CM | POA: Diagnosis not present

## 2018-05-12 DIAGNOSIS — H5211 Myopia, right eye: Secondary | ICD-10-CM | POA: Diagnosis not present

## 2018-05-12 DIAGNOSIS — H52223 Regular astigmatism, bilateral: Secondary | ICD-10-CM | POA: Diagnosis not present

## 2018-06-28 DIAGNOSIS — N5201 Erectile dysfunction due to arterial insufficiency: Secondary | ICD-10-CM | POA: Diagnosis not present

## 2018-06-28 DIAGNOSIS — N401 Enlarged prostate with lower urinary tract symptoms: Secondary | ICD-10-CM | POA: Diagnosis not present

## 2018-06-28 DIAGNOSIS — R351 Nocturia: Secondary | ICD-10-CM | POA: Diagnosis not present

## 2018-07-28 DIAGNOSIS — D2262 Melanocytic nevi of left upper limb, including shoulder: Secondary | ICD-10-CM | POA: Diagnosis not present

## 2018-07-28 DIAGNOSIS — L821 Other seborrheic keratosis: Secondary | ICD-10-CM | POA: Diagnosis not present

## 2018-07-28 DIAGNOSIS — D2261 Melanocytic nevi of right upper limb, including shoulder: Secondary | ICD-10-CM | POA: Diagnosis not present

## 2018-07-28 DIAGNOSIS — Z23 Encounter for immunization: Secondary | ICD-10-CM | POA: Diagnosis not present

## 2018-07-28 DIAGNOSIS — D225 Melanocytic nevi of trunk: Secondary | ICD-10-CM | POA: Diagnosis not present

## 2018-07-28 DIAGNOSIS — L570 Actinic keratosis: Secondary | ICD-10-CM | POA: Diagnosis not present

## 2018-07-28 DIAGNOSIS — D1801 Hemangioma of skin and subcutaneous tissue: Secondary | ICD-10-CM | POA: Diagnosis not present

## 2018-07-28 DIAGNOSIS — L814 Other melanin hyperpigmentation: Secondary | ICD-10-CM | POA: Diagnosis not present

## 2018-12-27 DIAGNOSIS — H1045 Other chronic allergic conjunctivitis: Secondary | ICD-10-CM | POA: Diagnosis not present

## 2018-12-27 DIAGNOSIS — J45990 Exercise induced bronchospasm: Secondary | ICD-10-CM | POA: Diagnosis not present

## 2018-12-27 DIAGNOSIS — J300 Vasomotor rhinitis: Secondary | ICD-10-CM | POA: Diagnosis not present

## 2018-12-27 DIAGNOSIS — K219 Gastro-esophageal reflux disease without esophagitis: Secondary | ICD-10-CM | POA: Diagnosis not present

## 2019-03-01 DIAGNOSIS — E7849 Other hyperlipidemia: Secondary | ICD-10-CM | POA: Diagnosis not present

## 2019-03-01 DIAGNOSIS — E559 Vitamin D deficiency, unspecified: Secondary | ICD-10-CM | POA: Diagnosis not present

## 2019-03-01 DIAGNOSIS — Z125 Encounter for screening for malignant neoplasm of prostate: Secondary | ICD-10-CM | POA: Diagnosis not present

## 2019-03-03 DIAGNOSIS — R82998 Other abnormal findings in urine: Secondary | ICD-10-CM | POA: Diagnosis not present

## 2019-03-08 DIAGNOSIS — R0600 Dyspnea, unspecified: Secondary | ICD-10-CM | POA: Diagnosis not present

## 2019-03-08 DIAGNOSIS — G252 Other specified forms of tremor: Secondary | ICD-10-CM | POA: Diagnosis not present

## 2019-03-08 DIAGNOSIS — M791 Myalgia, unspecified site: Secondary | ICD-10-CM | POA: Diagnosis not present

## 2019-03-08 DIAGNOSIS — E785 Hyperlipidemia, unspecified: Secondary | ICD-10-CM | POA: Diagnosis not present

## 2019-03-08 DIAGNOSIS — M2550 Pain in unspecified joint: Secondary | ICD-10-CM | POA: Diagnosis not present

## 2019-03-08 DIAGNOSIS — N401 Enlarged prostate with lower urinary tract symptoms: Secondary | ICD-10-CM | POA: Diagnosis not present

## 2019-03-08 DIAGNOSIS — K219 Gastro-esophageal reflux disease without esophagitis: Secondary | ICD-10-CM | POA: Diagnosis not present

## 2019-03-08 DIAGNOSIS — D509 Iron deficiency anemia, unspecified: Secondary | ICD-10-CM | POA: Diagnosis not present

## 2019-03-08 DIAGNOSIS — E559 Vitamin D deficiency, unspecified: Secondary | ICD-10-CM | POA: Diagnosis not present

## 2019-03-08 DIAGNOSIS — Z0000 Encounter for general adult medical examination without abnormal findings: Secondary | ICD-10-CM | POA: Diagnosis not present

## 2019-03-08 DIAGNOSIS — Z1331 Encounter for screening for depression: Secondary | ICD-10-CM | POA: Diagnosis not present

## 2019-03-08 DIAGNOSIS — F5221 Male erectile disorder: Secondary | ICD-10-CM | POA: Diagnosis not present

## 2019-03-08 DIAGNOSIS — F419 Anxiety disorder, unspecified: Secondary | ICD-10-CM | POA: Diagnosis not present

## 2019-03-08 DIAGNOSIS — R7989 Other specified abnormal findings of blood chemistry: Secondary | ICD-10-CM | POA: Diagnosis not present

## 2019-03-08 DIAGNOSIS — M1990 Unspecified osteoarthritis, unspecified site: Secondary | ICD-10-CM | POA: Diagnosis not present

## 2019-05-24 DIAGNOSIS — H25099 Other age-related incipient cataract, unspecified eye: Secondary | ICD-10-CM | POA: Diagnosis not present

## 2019-05-24 DIAGNOSIS — H5211 Myopia, right eye: Secondary | ICD-10-CM | POA: Diagnosis not present

## 2019-05-24 DIAGNOSIS — H524 Presbyopia: Secondary | ICD-10-CM | POA: Diagnosis not present

## 2019-05-24 DIAGNOSIS — H52223 Regular astigmatism, bilateral: Secondary | ICD-10-CM | POA: Diagnosis not present

## 2019-07-01 DIAGNOSIS — N401 Enlarged prostate with lower urinary tract symptoms: Secondary | ICD-10-CM | POA: Diagnosis not present

## 2019-07-01 DIAGNOSIS — R351 Nocturia: Secondary | ICD-10-CM | POA: Diagnosis not present

## 2019-07-28 DIAGNOSIS — Z23 Encounter for immunization: Secondary | ICD-10-CM | POA: Diagnosis not present

## 2019-09-14 DIAGNOSIS — L918 Other hypertrophic disorders of the skin: Secondary | ICD-10-CM | POA: Diagnosis not present

## 2019-09-14 DIAGNOSIS — L570 Actinic keratosis: Secondary | ICD-10-CM | POA: Diagnosis not present

## 2019-09-14 DIAGNOSIS — D225 Melanocytic nevi of trunk: Secondary | ICD-10-CM | POA: Diagnosis not present

## 2019-09-14 DIAGNOSIS — D1801 Hemangioma of skin and subcutaneous tissue: Secondary | ICD-10-CM | POA: Diagnosis not present

## 2019-09-14 DIAGNOSIS — L821 Other seborrheic keratosis: Secondary | ICD-10-CM | POA: Diagnosis not present

## 2019-11-15 ENCOUNTER — Ambulatory Visit: Payer: Medicare Other

## 2019-11-24 ENCOUNTER — Ambulatory Visit: Payer: Medicare Other | Attending: Internal Medicine

## 2019-11-24 DIAGNOSIS — Z23 Encounter for immunization: Secondary | ICD-10-CM | POA: Insufficient documentation

## 2019-11-24 NOTE — Progress Notes (Signed)
   Covid-19 Vaccination Clinic  Name:  James Chavez    MRN: [id number] DOB: [birth year]  11/24/2019  Mr. James Chavez was observed post Covid-19 immunization for 15 minutes without incidence. He was provided with Vaccine Information Sheet and instruction to access the V-Safe system.   Mr. James Chavez was instructed to call 911 with any severe reactions post vaccine: Marland Kitchen Difficulty breathing  . Swelling of your face and throat  . A fast heartbeat  . A bad rash all over your body  . Dizziness and weakness    Immunizations Administered    Name Date Dose VIS Date Route   Pfizer COVID-19 Vaccine 11/24/2019  5:21 PM 0.3 mL 09/30/2019 Intramuscular   Manufacturer: [place]   Lot: [id number]   [place]: [id number]

## 2019-12-02 ENCOUNTER — Ambulatory Visit: Payer: Medicare Other

## 2019-12-20 ENCOUNTER — Ambulatory Visit: Payer: Medicare Other

## 2019-12-20 DIAGNOSIS — R21 Rash and other nonspecific skin eruption: Secondary | ICD-10-CM | POA: Diagnosis not present

## 2019-12-20 DIAGNOSIS — B029 Zoster without complications: Secondary | ICD-10-CM | POA: Diagnosis not present

## 2019-12-27 ENCOUNTER — Ambulatory Visit: Payer: Medicare Other | Attending: Internal Medicine

## 2019-12-27 DIAGNOSIS — J45990 Exercise induced bronchospasm: Secondary | ICD-10-CM | POA: Diagnosis not present

## 2019-12-27 DIAGNOSIS — H1045 Other chronic allergic conjunctivitis: Secondary | ICD-10-CM | POA: Diagnosis not present

## 2019-12-27 DIAGNOSIS — K219 Gastro-esophageal reflux disease without esophagitis: Secondary | ICD-10-CM | POA: Diagnosis not present

## 2019-12-27 DIAGNOSIS — Z23 Encounter for immunization: Secondary | ICD-10-CM | POA: Insufficient documentation

## 2019-12-27 DIAGNOSIS — J300 Vasomotor rhinitis: Secondary | ICD-10-CM | POA: Diagnosis not present

## 2019-12-27 NOTE — Progress Notes (Signed)
   Covid-19 Vaccination Clinic  Name:  James Chavez    MRN: [id number] DOB: 12-11-1945  12/27/2019  James Chavez was observed post Covid-19 immunization for 15 minutes without incident. He was provided with Vaccine Information Sheet and instruction to access the V-Safe system.   James Chavez was instructed to call 911 with any severe reactions post vaccine: Marland Kitchen Difficulty breathing  . Swelling of face and throat  . A fast heartbeat  . A bad rash all over body  . Dizziness and weakness   Immunizations Administered    Name Date Dose VIS Date Route   Pfizer COVID-19 Vaccine 12/27/2019  8:18 AM 0.3 mL 09/30/2019 Intramuscular   Manufacturer: [place]   Lot: [id number]   [place]: [id number]

## 2020-03-05 DIAGNOSIS — E7849 Other hyperlipidemia: Secondary | ICD-10-CM | POA: Diagnosis not present

## 2020-03-05 DIAGNOSIS — Z125 Encounter for screening for malignant neoplasm of prostate: Secondary | ICD-10-CM | POA: Diagnosis not present

## 2020-03-05 DIAGNOSIS — E559 Vitamin D deficiency, unspecified: Secondary | ICD-10-CM | POA: Diagnosis not present

## 2020-03-12 DIAGNOSIS — E785 Hyperlipidemia, unspecified: Secondary | ICD-10-CM | POA: Diagnosis not present

## 2020-03-12 DIAGNOSIS — K219 Gastro-esophageal reflux disease without esophagitis: Secondary | ICD-10-CM | POA: Diagnosis not present

## 2020-03-12 DIAGNOSIS — B029 Zoster without complications: Secondary | ICD-10-CM | POA: Diagnosis not present

## 2020-03-12 DIAGNOSIS — J45909 Unspecified asthma, uncomplicated: Secondary | ICD-10-CM | POA: Diagnosis not present

## 2020-03-12 DIAGNOSIS — Z1331 Encounter for screening for depression: Secondary | ICD-10-CM | POA: Diagnosis not present

## 2020-03-12 DIAGNOSIS — R82998 Other abnormal findings in urine: Secondary | ICD-10-CM | POA: Diagnosis not present

## 2020-03-12 DIAGNOSIS — N401 Enlarged prostate with lower urinary tract symptoms: Secondary | ICD-10-CM | POA: Diagnosis not present

## 2020-03-12 DIAGNOSIS — E559 Vitamin D deficiency, unspecified: Secondary | ICD-10-CM | POA: Diagnosis not present

## 2020-03-12 DIAGNOSIS — D509 Iron deficiency anemia, unspecified: Secondary | ICD-10-CM | POA: Diagnosis not present

## 2020-03-12 DIAGNOSIS — F419 Anxiety disorder, unspecified: Secondary | ICD-10-CM | POA: Diagnosis not present

## 2020-03-12 DIAGNOSIS — Z0000 Encounter for general adult medical examination without abnormal findings: Secondary | ICD-10-CM | POA: Diagnosis not present

## 2020-03-14 DIAGNOSIS — Z1212 Encounter for screening for malignant neoplasm of rectum: Secondary | ICD-10-CM | POA: Diagnosis not present

## 2020-05-16 DIAGNOSIS — H52223 Regular astigmatism, bilateral: Secondary | ICD-10-CM | POA: Diagnosis not present

## 2020-05-16 DIAGNOSIS — H25099 Other age-related incipient cataract, unspecified eye: Secondary | ICD-10-CM | POA: Diagnosis not present

## 2020-05-16 DIAGNOSIS — H524 Presbyopia: Secondary | ICD-10-CM | POA: Diagnosis not present

## 2020-05-16 DIAGNOSIS — H5211 Myopia, right eye: Secondary | ICD-10-CM | POA: Diagnosis not present

## 2020-07-05 DIAGNOSIS — N5201 Erectile dysfunction due to arterial insufficiency: Secondary | ICD-10-CM | POA: Diagnosis not present

## 2020-07-05 DIAGNOSIS — R3912 Poor urinary stream: Secondary | ICD-10-CM | POA: Diagnosis not present

## 2020-07-05 DIAGNOSIS — N401 Enlarged prostate with lower urinary tract symptoms: Secondary | ICD-10-CM | POA: Diagnosis not present

## 2020-08-04 DIAGNOSIS — Z23 Encounter for immunization: Secondary | ICD-10-CM | POA: Diagnosis not present

## 2020-08-23 DIAGNOSIS — Z23 Encounter for immunization: Secondary | ICD-10-CM | POA: Diagnosis not present

## 2020-12-05 DIAGNOSIS — L570 Actinic keratosis: Secondary | ICD-10-CM | POA: Diagnosis not present

## 2020-12-05 DIAGNOSIS — D1801 Hemangioma of skin and subcutaneous tissue: Secondary | ICD-10-CM | POA: Diagnosis not present

## 2020-12-05 DIAGNOSIS — L821 Other seborrheic keratosis: Secondary | ICD-10-CM | POA: Diagnosis not present

## 2020-12-05 DIAGNOSIS — D225 Melanocytic nevi of trunk: Secondary | ICD-10-CM | POA: Diagnosis not present

## 2020-12-05 DIAGNOSIS — D2262 Melanocytic nevi of left upper limb, including shoulder: Secondary | ICD-10-CM | POA: Diagnosis not present

## 2020-12-05 DIAGNOSIS — L814 Other melanin hyperpigmentation: Secondary | ICD-10-CM | POA: Diagnosis not present

## 2020-12-05 DIAGNOSIS — D2271 Melanocytic nevi of right lower limb, including hip: Secondary | ICD-10-CM | POA: Diagnosis not present

## 2020-12-05 DIAGNOSIS — B351 Tinea unguium: Secondary | ICD-10-CM | POA: Diagnosis not present

## 2020-12-05 DIAGNOSIS — L308 Other specified dermatitis: Secondary | ICD-10-CM | POA: Diagnosis not present

## 2020-12-26 DIAGNOSIS — J45990 Exercise induced bronchospasm: Secondary | ICD-10-CM | POA: Diagnosis not present

## 2020-12-26 DIAGNOSIS — K219 Gastro-esophageal reflux disease without esophagitis: Secondary | ICD-10-CM | POA: Diagnosis not present

## 2020-12-26 DIAGNOSIS — H1045 Other chronic allergic conjunctivitis: Secondary | ICD-10-CM | POA: Diagnosis not present

## 2020-12-26 DIAGNOSIS — J300 Vasomotor rhinitis: Secondary | ICD-10-CM | POA: Diagnosis not present

## 2021-01-02 DIAGNOSIS — Z79899 Other long term (current) drug therapy: Secondary | ICD-10-CM | POA: Diagnosis not present

## 2021-02-04 DIAGNOSIS — B351 Tinea unguium: Secondary | ICD-10-CM | POA: Diagnosis not present

## 2021-02-04 DIAGNOSIS — L84 Corns and callosities: Secondary | ICD-10-CM | POA: Diagnosis not present

## 2021-03-13 DIAGNOSIS — E559 Vitamin D deficiency, unspecified: Secondary | ICD-10-CM | POA: Diagnosis not present

## 2021-03-13 DIAGNOSIS — Z125 Encounter for screening for malignant neoplasm of prostate: Secondary | ICD-10-CM | POA: Diagnosis not present

## 2021-03-13 DIAGNOSIS — E785 Hyperlipidemia, unspecified: Secondary | ICD-10-CM | POA: Diagnosis not present

## 2021-03-19 DIAGNOSIS — R7989 Other specified abnormal findings of blood chemistry: Secondary | ICD-10-CM | POA: Diagnosis not present

## 2021-03-19 DIAGNOSIS — D509 Iron deficiency anemia, unspecified: Secondary | ICD-10-CM | POA: Diagnosis not present

## 2021-03-20 DIAGNOSIS — Z0000 Encounter for general adult medical examination without abnormal findings: Secondary | ICD-10-CM | POA: Diagnosis not present

## 2021-03-20 DIAGNOSIS — M25552 Pain in left hip: Secondary | ICD-10-CM | POA: Diagnosis not present

## 2021-03-20 DIAGNOSIS — N401 Enlarged prostate with lower urinary tract symptoms: Secondary | ICD-10-CM | POA: Diagnosis not present

## 2021-03-20 DIAGNOSIS — Z1339 Encounter for screening examination for other mental health and behavioral disorders: Secondary | ICD-10-CM | POA: Diagnosis not present

## 2021-03-20 DIAGNOSIS — M19041 Primary osteoarthritis, right hand: Secondary | ICD-10-CM | POA: Diagnosis not present

## 2021-03-20 DIAGNOSIS — J300 Vasomotor rhinitis: Secondary | ICD-10-CM | POA: Diagnosis not present

## 2021-03-20 DIAGNOSIS — R251 Tremor, unspecified: Secondary | ICD-10-CM | POA: Diagnosis not present

## 2021-03-20 DIAGNOSIS — R82998 Other abnormal findings in urine: Secondary | ICD-10-CM | POA: Diagnosis not present

## 2021-03-20 DIAGNOSIS — R2689 Other abnormalities of gait and mobility: Secondary | ICD-10-CM | POA: Diagnosis not present

## 2021-03-20 DIAGNOSIS — Z1331 Encounter for screening for depression: Secondary | ICD-10-CM | POA: Diagnosis not present

## 2021-03-25 DIAGNOSIS — Z1212 Encounter for screening for malignant neoplasm of rectum: Secondary | ICD-10-CM | POA: Diagnosis not present

## 2021-03-25 DIAGNOSIS — Z23 Encounter for immunization: Secondary | ICD-10-CM | POA: Diagnosis not present

## 2021-03-29 DIAGNOSIS — M25552 Pain in left hip: Secondary | ICD-10-CM | POA: Diagnosis not present

## 2021-04-09 DIAGNOSIS — M7602 Gluteal tendinitis, left hip: Secondary | ICD-10-CM | POA: Diagnosis not present

## 2021-04-09 DIAGNOSIS — M25652 Stiffness of left hip, not elsewhere classified: Secondary | ICD-10-CM | POA: Diagnosis not present

## 2021-04-09 DIAGNOSIS — R531 Weakness: Secondary | ICD-10-CM | POA: Diagnosis not present

## 2021-04-11 DIAGNOSIS — R531 Weakness: Secondary | ICD-10-CM | POA: Diagnosis not present

## 2021-04-11 DIAGNOSIS — M25652 Stiffness of left hip, not elsewhere classified: Secondary | ICD-10-CM | POA: Diagnosis not present

## 2021-04-11 DIAGNOSIS — M7602 Gluteal tendinitis, left hip: Secondary | ICD-10-CM | POA: Diagnosis not present

## 2021-04-16 DIAGNOSIS — M25652 Stiffness of left hip, not elsewhere classified: Secondary | ICD-10-CM | POA: Diagnosis not present

## 2021-04-16 DIAGNOSIS — R531 Weakness: Secondary | ICD-10-CM | POA: Diagnosis not present

## 2021-04-16 DIAGNOSIS — M7602 Gluteal tendinitis, left hip: Secondary | ICD-10-CM | POA: Diagnosis not present

## 2021-04-18 DIAGNOSIS — M7602 Gluteal tendinitis, left hip: Secondary | ICD-10-CM | POA: Diagnosis not present

## 2021-04-18 DIAGNOSIS — M25652 Stiffness of left hip, not elsewhere classified: Secondary | ICD-10-CM | POA: Diagnosis not present

## 2021-04-18 DIAGNOSIS — R531 Weakness: Secondary | ICD-10-CM | POA: Diagnosis not present

## 2021-04-23 DIAGNOSIS — M7602 Gluteal tendinitis, left hip: Secondary | ICD-10-CM | POA: Diagnosis not present

## 2021-04-23 DIAGNOSIS — M25652 Stiffness of left hip, not elsewhere classified: Secondary | ICD-10-CM | POA: Diagnosis not present

## 2021-04-23 DIAGNOSIS — R531 Weakness: Secondary | ICD-10-CM | POA: Diagnosis not present

## 2021-04-25 DIAGNOSIS — M25652 Stiffness of left hip, not elsewhere classified: Secondary | ICD-10-CM | POA: Diagnosis not present

## 2021-04-25 DIAGNOSIS — R531 Weakness: Secondary | ICD-10-CM | POA: Diagnosis not present

## 2021-04-25 DIAGNOSIS — M7602 Gluteal tendinitis, left hip: Secondary | ICD-10-CM | POA: Diagnosis not present

## 2021-04-30 DIAGNOSIS — M7602 Gluteal tendinitis, left hip: Secondary | ICD-10-CM | POA: Diagnosis not present

## 2021-04-30 DIAGNOSIS — R531 Weakness: Secondary | ICD-10-CM | POA: Diagnosis not present

## 2021-04-30 DIAGNOSIS — M25652 Stiffness of left hip, not elsewhere classified: Secondary | ICD-10-CM | POA: Diagnosis not present

## 2021-05-02 DIAGNOSIS — M25652 Stiffness of left hip, not elsewhere classified: Secondary | ICD-10-CM | POA: Diagnosis not present

## 2021-05-02 DIAGNOSIS — R531 Weakness: Secondary | ICD-10-CM | POA: Diagnosis not present

## 2021-05-02 DIAGNOSIS — M7602 Gluteal tendinitis, left hip: Secondary | ICD-10-CM | POA: Diagnosis not present

## 2021-07-27 DIAGNOSIS — Z23 Encounter for immunization: Secondary | ICD-10-CM | POA: Diagnosis not present

## 2021-08-01 DIAGNOSIS — N5201 Erectile dysfunction due to arterial insufficiency: Secondary | ICD-10-CM | POA: Diagnosis not present

## 2021-08-01 DIAGNOSIS — R3912 Poor urinary stream: Secondary | ICD-10-CM | POA: Diagnosis not present

## 2021-08-01 DIAGNOSIS — N401 Enlarged prostate with lower urinary tract symptoms: Secondary | ICD-10-CM | POA: Diagnosis not present

## 2021-08-15 DIAGNOSIS — H52223 Regular astigmatism, bilateral: Secondary | ICD-10-CM | POA: Diagnosis not present

## 2021-08-15 DIAGNOSIS — H2513 Age-related nuclear cataract, bilateral: Secondary | ICD-10-CM | POA: Diagnosis not present

## 2021-08-15 DIAGNOSIS — Z135 Encounter for screening for eye and ear disorders: Secondary | ICD-10-CM | POA: Diagnosis not present

## 2021-08-15 DIAGNOSIS — H5213 Myopia, bilateral: Secondary | ICD-10-CM | POA: Diagnosis not present

## 2021-08-15 DIAGNOSIS — H524 Presbyopia: Secondary | ICD-10-CM | POA: Diagnosis not present

## 2021-08-27 DIAGNOSIS — L814 Other melanin hyperpigmentation: Secondary | ICD-10-CM | POA: Diagnosis not present

## 2021-08-27 DIAGNOSIS — D2262 Melanocytic nevi of left upper limb, including shoulder: Secondary | ICD-10-CM | POA: Diagnosis not present

## 2021-08-27 DIAGNOSIS — D225 Melanocytic nevi of trunk: Secondary | ICD-10-CM | POA: Diagnosis not present

## 2021-08-27 DIAGNOSIS — L821 Other seborrheic keratosis: Secondary | ICD-10-CM | POA: Diagnosis not present

## 2021-08-27 DIAGNOSIS — L570 Actinic keratosis: Secondary | ICD-10-CM | POA: Diagnosis not present

## 2021-08-27 DIAGNOSIS — D1801 Hemangioma of skin and subcutaneous tissue: Secondary | ICD-10-CM | POA: Diagnosis not present

## 2022-08-02 DIAGNOSIS — Z23 Encounter for immunization: Secondary | ICD-10-CM | POA: Diagnosis not present

## 2022-08-07 DIAGNOSIS — R3912 Poor urinary stream: Secondary | ICD-10-CM | POA: Diagnosis not present

## 2022-08-07 DIAGNOSIS — N401 Enlarged prostate with lower urinary tract symptoms: Secondary | ICD-10-CM | POA: Diagnosis not present

## 2022-08-14 DIAGNOSIS — K621 Rectal polyp: Secondary | ICD-10-CM | POA: Diagnosis not present

## 2022-08-14 DIAGNOSIS — Z09 Encounter for follow-up examination after completed treatment for conditions other than malignant neoplasm: Secondary | ICD-10-CM | POA: Diagnosis not present

## 2022-08-14 DIAGNOSIS — D120 Benign neoplasm of cecum: Secondary | ICD-10-CM | POA: Diagnosis not present

## 2022-08-14 DIAGNOSIS — K649 Unspecified hemorrhoids: Secondary | ICD-10-CM | POA: Diagnosis not present

## 2022-08-14 DIAGNOSIS — D123 Benign neoplasm of transverse colon: Secondary | ICD-10-CM | POA: Diagnosis not present

## 2022-08-14 DIAGNOSIS — Z86010 Personal history of colonic polyps: Secondary | ICD-10-CM | POA: Diagnosis not present

## 2022-08-18 DIAGNOSIS — D123 Benign neoplasm of transverse colon: Secondary | ICD-10-CM | POA: Diagnosis not present

## 2022-08-18 DIAGNOSIS — D120 Benign neoplasm of cecum: Secondary | ICD-10-CM | POA: Diagnosis not present

## 2022-08-18 DIAGNOSIS — K621 Rectal polyp: Secondary | ICD-10-CM | POA: Diagnosis not present

## 2022-08-28 DIAGNOSIS — N401 Enlarged prostate with lower urinary tract symptoms: Secondary | ICD-10-CM | POA: Diagnosis not present

## 2022-08-28 DIAGNOSIS — R3914 Feeling of incomplete bladder emptying: Secondary | ICD-10-CM | POA: Diagnosis not present

## 2022-09-01 DIAGNOSIS — L918 Other hypertrophic disorders of the skin: Secondary | ICD-10-CM | POA: Diagnosis not present

## 2022-09-01 DIAGNOSIS — D225 Melanocytic nevi of trunk: Secondary | ICD-10-CM | POA: Diagnosis not present

## 2022-09-01 DIAGNOSIS — L570 Actinic keratosis: Secondary | ICD-10-CM | POA: Diagnosis not present

## 2022-09-01 DIAGNOSIS — D1801 Hemangioma of skin and subcutaneous tissue: Secondary | ICD-10-CM | POA: Diagnosis not present

## 2022-09-01 DIAGNOSIS — D2262 Melanocytic nevi of left upper limb, including shoulder: Secondary | ICD-10-CM | POA: Diagnosis not present

## 2022-09-01 DIAGNOSIS — L821 Other seborrheic keratosis: Secondary | ICD-10-CM | POA: Diagnosis not present

## 2022-09-09 ENCOUNTER — Other Ambulatory Visit: Payer: Self-pay | Admitting: Urology

## 2022-10-09 DIAGNOSIS — R351 Nocturia: Secondary | ICD-10-CM | POA: Diagnosis not present

## 2022-10-09 DIAGNOSIS — N401 Enlarged prostate with lower urinary tract symptoms: Secondary | ICD-10-CM | POA: Diagnosis not present

## 2022-10-09 DIAGNOSIS — R3912 Poor urinary stream: Secondary | ICD-10-CM | POA: Diagnosis not present

## 2022-10-09 DIAGNOSIS — Z125 Encounter for screening for malignant neoplasm of prostate: Secondary | ICD-10-CM | POA: Diagnosis not present

## 2022-10-16 ENCOUNTER — Encounter (HOSPITAL_BASED_OUTPATIENT_CLINIC_OR_DEPARTMENT_OTHER): Payer: Self-pay | Admitting: Urology

## 2022-10-16 NOTE — Progress Notes (Signed)
Spoke w/ via phone for pre-op interview--- pt Lab needs dos----             Lab results------ COVID test -----patient states asymptomatic no test needed Arrive at ------- 0930 NPO after MN NO Solid Food.  Clear liquids from MN until--- 0830 Med rec completed Medications to take morning of surgery ----- N/A Diabetic medication ----- N/A Patient instructed no nail polish to be worn day of surgery Patient instructed to bring photo id and insurance card day of surgery Patient aware to have Driver (ride ) / caregiver    for 24 hours after surgery: James Chavez Patient Special Instructions ----- Bring inhaler DOS Pre-Op special Istructions ----- Patient verbalized understanding of instructions that were given at this phone interview. Patient denies shortness of breath, chest pain, fever, cough at this phone interview.

## 2022-10-21 NOTE — Anesthesia Preprocedure Evaluation (Addendum)
Anesthesia Evaluation  Patient identified by MRN, date of birth, ID band Patient awake    Reviewed: Allergy & Precautions, NPO status , Patient's Chart, lab work & pertinent test results  Airway Mallampati: II  TM Distance: >3 FB Neck ROM: Full    Dental no notable dental hx. (+) Teeth Intact, Dental Advisory Given   Pulmonary neg pulmonary ROS On ihaler since covid 03/2022   Pulmonary exam normal breath sounds clear to auscultation       Cardiovascular negative cardio ROS Normal cardiovascular exam Rhythm:Regular Rate:Normal     Neuro/Psych  PSYCHIATRIC DISORDERS  Depression    negative neurological ROS     GI/Hepatic Neg liver ROS,GERD  ,,  Endo/Other  negative endocrine ROS    Renal/GU negative Renal ROS   BPH    Musculoskeletal   Abdominal   Peds  Hematology   Anesthesia Other Findings   Reproductive/Obstetrics                             Anesthesia Physical Anesthesia Plan  ASA: 2  Anesthesia Plan: General   Post-op Pain Management: Ofirmev IV (intra-op)*   Induction: Intravenous  PONV Risk Score and Plan: Treatment may vary due to age or medical condition and Ondansetron  Airway Management Planned: LMA  Additional Equipment: None  Intra-op Plan:   Post-operative Plan:   Informed Consent: I have reviewed the patients History and Physical, chart, labs and discussed the procedure including the risks, benefits and alternatives for the proposed anesthesia with the patient or authorized representative who has indicated his/her understanding and acceptance.     Dental advisory given  Plan Discussed with:   Anesthesia Plan Comments:        Anesthesia Quick Evaluation

## 2022-10-22 ENCOUNTER — Ambulatory Visit (HOSPITAL_BASED_OUTPATIENT_CLINIC_OR_DEPARTMENT_OTHER): Payer: Medicare PPO | Admitting: Anesthesiology

## 2022-10-22 ENCOUNTER — Observation Stay (HOSPITAL_BASED_OUTPATIENT_CLINIC_OR_DEPARTMENT_OTHER)
Admission: RE | Admit: 2022-10-22 | Discharge: 2022-10-23 | Disposition: A | Discharge status: Home / Self Care | Payer: Medicare PPO | Attending: Urology | Admitting: Urology

## 2022-10-22 ENCOUNTER — Encounter (HOSPITAL_BASED_OUTPATIENT_CLINIC_OR_DEPARTMENT_OTHER): Payer: Self-pay | Admitting: Urology

## 2022-10-22 ENCOUNTER — Encounter (HOSPITAL_BASED_OUTPATIENT_CLINIC_OR_DEPARTMENT_OTHER)
Admission: RE | Disposition: A | Discharge status: Home / Self Care | Payer: Self-pay | Source: Home / Self Care | Attending: Urology

## 2022-10-22 ENCOUNTER — Other Ambulatory Visit: Payer: Self-pay

## 2022-10-22 DIAGNOSIS — Z01818 Encounter for other preprocedural examination: Secondary | ICD-10-CM

## 2022-10-22 DIAGNOSIS — R351 Nocturia: Secondary | ICD-10-CM | POA: Insufficient documentation

## 2022-10-22 DIAGNOSIS — N400 Enlarged prostate without lower urinary tract symptoms: Secondary | ICD-10-CM | POA: Diagnosis not present

## 2022-10-22 DIAGNOSIS — N320 Bladder-neck obstruction: Secondary | ICD-10-CM | POA: Insufficient documentation

## 2022-10-22 DIAGNOSIS — N138 Other obstructive and reflux uropathy: Secondary | ICD-10-CM | POA: Diagnosis not present

## 2022-10-22 DIAGNOSIS — Z79899 Other long term (current) drug therapy: Secondary | ICD-10-CM | POA: Insufficient documentation

## 2022-10-22 DIAGNOSIS — R3912 Poor urinary stream: Secondary | ICD-10-CM | POA: Insufficient documentation

## 2022-10-22 DIAGNOSIS — N401 Enlarged prostate with lower urinary tract symptoms: Secondary | ICD-10-CM | POA: Diagnosis not present

## 2022-10-22 HISTORY — DX: Depression, unspecified: F32.A

## 2022-10-22 HISTORY — DX: Gastro-esophageal reflux disease without esophagitis: K21.9

## 2022-10-22 HISTORY — PX: TRANSURETHRAL RESECTION OF PROSTATE: SHX73

## 2022-10-22 LAB — BASIC METABOLIC PANEL
Anion gap: 6 (ref 5–15)
BUN: 17 mg/dL (ref 8–23)
CO2: 26 mmol/L (ref 22–32)
Calcium: 7.8 mg/dL — ABNORMAL LOW (ref 8.9–10.3)
Chloride: 105 mmol/L (ref 98–111)
Creatinine, Ser: 0.75 mg/dL (ref 0.61–1.24)
GFR, Estimated: >60 mL/min (ref >60)
Glucose, Bld: 134 mg/dL — ABNORMAL HIGH (ref 70–99)
Potassium: 4.4 mmol/L (ref 3.5–5.1)
Sodium: 137 mmol/L (ref 135–145)

## 2022-10-22 LAB — PSA: Prostatic Specific Antigen: 1.89 ng/mL (ref 0.00–4.00)

## 2022-10-22 LAB — HEMOGLOBIN AND HEMATOCRIT, BLOOD
HCT: 41.9 % (ref 39.0–52.0)
Hemoglobin: 14 g/dL (ref 13.0–17.0)

## 2022-10-22 SURGERY — TURP (TRANSURETHRAL RESECTION OF PROSTATE)
Anesthesia: General | Site: Prostate

## 2022-10-22 MED ORDER — HYDROMORPHONE HCL 1 MG/ML IJ SOLN
INTRAMUSCULAR | Status: AC
  Filled 2022-10-22: qty 1

## 2022-10-22 MED ORDER — OXYBUTYNIN CHLORIDE 5 MG PO TABS
ORAL_TABLET | ORAL | Status: AC
  Filled 2022-10-22: qty 1

## 2022-10-22 MED ORDER — CEFAZOLIN SODIUM-DEXTROSE 2-4 GM/100ML-% IV SOLN
2.0000 g | INTRAVENOUS | Status: AC
  Administered 2022-10-22: 2 g via INTRAVENOUS

## 2022-10-22 MED ORDER — MIDAZOLAM HCL 2 MG/2ML IJ SOLN
INTRAMUSCULAR | Status: DC | PRN
  Administered 2022-10-22: 1 mg via INTRAVENOUS

## 2022-10-22 MED ORDER — ONDANSETRON HCL 4 MG/2ML IJ SOLN: 4.0000 mg | INTRAMUSCULAR | Status: DC | PRN

## 2022-10-22 MED ORDER — LACTATED RINGERS IV SOLN
INTRAVENOUS | Status: DC
  Administered 2022-10-22: 1000 mL via INTRAVENOUS

## 2022-10-22 MED ORDER — ACETAMINOPHEN 10 MG/ML IV SOLN
1000.0000 mg | Freq: Once | INTRAVENOUS | Status: DC | PRN
  Administered 2022-10-22: 1000 mg via INTRAVENOUS

## 2022-10-22 MED ORDER — LIDOCAINE 2% (20 MG/ML) 5 ML SYRINGE
INTRAMUSCULAR | Status: DC | PRN
  Administered 2022-10-22: 100 mg via INTRAVENOUS

## 2022-10-22 MED ORDER — ACETAMINOPHEN 325 MG PO TABS
650.0000 mg | ORAL_TABLET | ORAL | Status: DC | PRN
  Administered 2022-10-23 (×2): 650 mg via ORAL

## 2022-10-22 MED ORDER — HYDROCODONE-ACETAMINOPHEN 5-325 MG PO TABS
1.0000 | ORAL_TABLET | ORAL | Status: DC | PRN
  Administered 2022-10-22: 1 via ORAL

## 2022-10-22 MED ORDER — FENTANYL CITRATE (PF) 100 MCG/2ML IJ SOLN
INTRAMUSCULAR | Status: AC
  Filled 2022-10-22: qty 2

## 2022-10-22 MED ORDER — STERILE WATER FOR IRRIGATION IR SOLN
Status: DC | PRN
  Administered 2022-10-22: 500 mL

## 2022-10-22 MED ORDER — HYDROCODONE-ACETAMINOPHEN 5-325 MG PO TABS
ORAL_TABLET | ORAL | Status: AC
  Filled 2022-10-22: qty 2

## 2022-10-22 MED ORDER — TRAMADOL HCL 50 MG PO TABS: 50.0000 mg | ORAL_TABLET | Freq: Four times a day (QID) | ORAL | 0 refills | Status: AC | PRN

## 2022-10-22 MED ORDER — HYDROMORPHONE HCL 1 MG/ML IJ SOLN
0.5000 mg | INTRAMUSCULAR | Status: DC | PRN
  Administered 2022-10-22: 1 mg via INTRAVENOUS

## 2022-10-22 MED ORDER — SODIUM CHLORIDE 0.9 % IV SOLN: INTRAVENOUS | Status: DC

## 2022-10-22 MED ORDER — CEPHALEXIN 500 MG PO CAPS: 500.0000 mg | ORAL_CAPSULE | Freq: Two times a day (BID) | ORAL | 0 refills | Status: AC

## 2022-10-22 MED ORDER — SODIUM CHLORIDE 0.9 % IR SOLN: 3000.0000 mL | Status: DC

## 2022-10-22 MED ORDER — ZOLPIDEM TARTRATE 5 MG PO TABS: 5.0000 mg | ORAL_TABLET | Freq: Every evening | ORAL | Status: DC | PRN

## 2022-10-22 MED ORDER — OXYBUTYNIN CHLORIDE 5 MG PO TABS
5.0000 mg | ORAL_TABLET | Freq: Three times a day (TID) | ORAL | Status: DC | PRN
  Administered 2022-10-22 (×2): 5 mg via ORAL

## 2022-10-22 MED ORDER — ARTIFICIAL TEARS OPHTHALMIC OINT
TOPICAL_OINTMENT | OPHTHALMIC | Status: AC
  Filled 2022-10-22: qty 7

## 2022-10-22 MED ORDER — SENNA 8.6 MG PO TABS
ORAL_TABLET | ORAL | Status: AC
  Filled 2022-10-22: qty 1

## 2022-10-22 MED ORDER — ONDANSETRON HCL 4 MG/2ML IJ SOLN
INTRAMUSCULAR | Status: DC | PRN
  Administered 2022-10-22: 4 mg via INTRAVENOUS

## 2022-10-22 MED ORDER — ONDANSETRON HCL 4 MG/2ML IJ SOLN: 4.0000 mg | Freq: Once | INTRAMUSCULAR | Status: DC | PRN

## 2022-10-22 MED ORDER — FLUOXETINE HCL 20 MG PO CAPS
20.0000 mg | ORAL_CAPSULE | Freq: Every day | ORAL | Status: DC
  Administered 2022-10-22: 20 mg via ORAL
  Filled 2022-10-22 (×2): qty 1

## 2022-10-22 MED ORDER — PHENYLEPHRINE 80 MCG/ML (10ML) SYRINGE FOR IV PUSH (FOR BLOOD PRESSURE SUPPORT)
PREFILLED_SYRINGE | INTRAVENOUS | Status: DC | PRN
  Administered 2022-10-22: 80 ug via INTRAVENOUS

## 2022-10-22 MED ORDER — ACETAMINOPHEN 10 MG/ML IV SOLN
INTRAVENOUS | Status: AC
  Filled 2022-10-22: qty 100

## 2022-10-22 MED ORDER — PROPOFOL 10 MG/ML IV BOLUS
INTRAVENOUS | Status: AC
  Filled 2022-10-22: qty 20

## 2022-10-22 MED ORDER — SENNOSIDES-DOCUSATE SODIUM 8.6-50 MG PO TABS
1.0000 | ORAL_TABLET | Freq: Every evening | ORAL | Status: DC | PRN
  Administered 2022-10-22: 1 via ORAL
  Filled 2022-10-22: qty 1

## 2022-10-22 MED ORDER — SODIUM CHLORIDE 0.9 % IR SOLN
Status: DC | PRN
  Administered 2022-10-22 (×5): 6000 mL

## 2022-10-22 MED ORDER — PHENAZOPYRIDINE HCL 200 MG PO TABS: 200.0000 mg | ORAL_TABLET | Freq: Three times a day (TID) | ORAL | 0 refills | Status: AC | PRN

## 2022-10-22 MED ORDER — CEFAZOLIN SODIUM-DEXTROSE 2-4 GM/100ML-% IV SOLN
INTRAVENOUS | Status: AC
  Filled 2022-10-22: qty 100

## 2022-10-22 MED ORDER — PROPOFOL 10 MG/ML IV BOLUS
INTRAVENOUS | Status: DC | PRN
  Administered 2022-10-22: 140 mg via INTRAVENOUS

## 2022-10-22 MED ORDER — MIDAZOLAM HCL 2 MG/2ML IJ SOLN
INTRAMUSCULAR | Status: AC
  Filled 2022-10-22: qty 2

## 2022-10-22 MED ORDER — OXYBUTYNIN CHLORIDE 5 MG PO TABS: 5.0000 mg | ORAL_TABLET | Freq: Three times a day (TID) | ORAL | 1 refills | Status: AC | PRN

## 2022-10-22 MED ORDER — DIPHENHYDRAMINE HCL 50 MG/ML IJ SOLN: 12.5000 mg | Freq: Four times a day (QID) | INTRAMUSCULAR | Status: DC | PRN

## 2022-10-22 MED ORDER — EPHEDRINE SULFATE-NACL 50-0.9 MG/10ML-% IV SOSY
PREFILLED_SYRINGE | INTRAVENOUS | Status: DC | PRN
  Administered 2022-10-22 (×4): 5 mg via INTRAVENOUS

## 2022-10-22 MED ORDER — CEFAZOLIN SODIUM-DEXTROSE 1-4 GM/50ML-% IV SOLN
INTRAVENOUS | Status: AC
  Filled 2022-10-22: qty 50

## 2022-10-22 MED ORDER — FENTANYL CITRATE (PF) 250 MCG/5ML IJ SOLN
INTRAMUSCULAR | Status: DC | PRN
  Administered 2022-10-22: 50 ug via INTRAVENOUS
  Administered 2022-10-22 (×3): 25 ug via INTRAVENOUS

## 2022-10-22 MED ORDER — CEFAZOLIN SODIUM-DEXTROSE 1-4 GM/50ML-% IV SOLN
1.0000 g | Freq: Three times a day (TID) | INTRAVENOUS | Status: DC
  Administered 2022-10-22 – 2022-10-23 (×2): 1 g via INTRAVENOUS

## 2022-10-22 MED ORDER — DEXAMETHASONE SODIUM PHOSPHATE 10 MG/ML IJ SOLN
INTRAMUSCULAR | Status: DC | PRN
  Administered 2022-10-22: 5 mg via INTRAVENOUS

## 2022-10-22 MED ORDER — FENTANYL CITRATE (PF) 100 MCG/2ML IJ SOLN: 25.0000 ug | INTRAMUSCULAR | Status: DC | PRN

## 2022-10-22 MED ORDER — DIPHENHYDRAMINE HCL 12.5 MG/5ML PO ELIX: 12.5000 mg | ORAL_SOLUTION | Freq: Four times a day (QID) | ORAL | Status: DC | PRN

## 2022-10-22 SURGICAL SUPPLY — 23 items
BAG DRAIN URO-CYSTO SKYTR STRL (DRAIN) ×1 IMPLANT
BAG DRN RND TRDRP ANRFLXCHMBR (UROLOGICAL SUPPLIES) ×1
BAG DRN UROCATH (DRAIN) ×1
BAG URINE DRAIN 2000ML AR STRL (UROLOGICAL SUPPLIES) ×1 IMPLANT
BAND INSRT 18 STRL LF DISP RB (MISCELLANEOUS)
BAND RUBBER #18 3X1/16 STRL (MISCELLANEOUS) IMPLANT
CATH FOLEY 3WAY 30CC 22FR (CATHETERS) IMPLANT
CATH FOLEY 3WAY 30CC 24FR (CATHETERS) ×1
CATH URTH STD 24FR FL 3W 2 (CATHETERS) IMPLANT
GLOVE BIO SURGEON STRL SZ7.5 (GLOVE) ×1 IMPLANT
GOWN STRL REUS W/TWL XL LVL3 (GOWN DISPOSABLE) ×1 IMPLANT
HOLDER FOLEY CATH W/STRAP (MISCELLANEOUS) IMPLANT
IV NS IRRIG 3000ML ARTHROMATIC (IV SOLUTION) ×2 IMPLANT
KIT TURNOVER CYSTO (KITS) ×1 IMPLANT
LOOP CUT BIPOLAR 24F LRG (ELECTROSURGICAL) IMPLANT
MANIFOLD NEPTUNE II (INSTRUMENTS) ×1 IMPLANT
PACK CYSTO (CUSTOM PROCEDURE TRAY) ×1 IMPLANT
PIN SAFETY STERILE (MISCELLANEOUS) ×1 IMPLANT
SYR 30ML LL (SYRINGE) ×1 IMPLANT
SYR TOOMEY IRRIG 70ML (MISCELLANEOUS) ×1
SYRINGE TOOMEY IRRIG 70ML (MISCELLANEOUS) ×1 IMPLANT
TUBE CONNECTING 12X1/4 (SUCTIONS) ×1 IMPLANT
TUBING UROLOGY SET (TUBING) ×1 IMPLANT

## 2022-10-22 NOTE — Anesthesia Procedure Notes (Signed)
Procedure Name: LMA Insertion Date/Time: 10/22/2022 11:20 AM  Performed by: Clearnce Sorrel, CRNAPre-anesthesia Checklist: Patient identified, Emergency Drugs available, Suction available and Patient being monitored Patient Re-evaluated:Patient Re-evaluated prior to induction Oxygen Delivery Method: Circle System Utilized Preoxygenation: Pre-oxygenation with 100% oxygen Induction Type: IV induction Ventilation: Mask ventilation without difficulty LMA: LMA inserted LMA Size: 4.0 Number of attempts: 1 Airway Equipment and Method: Bite block Placement Confirmation: positive ETCO2 Tube secured with: Tape Dental Injury: Teeth and Oropharynx as per pre-operative assessment

## 2022-10-22 NOTE — Progress Notes (Signed)
10/22/2022 5:40 PM  Foley catheter care and maintenance education provided and reviewed with patient. Questions and concerns addressed. Advised that RN will repeat these instructions in the morning to confirm adequate understanding prior to discharge.  [place] Agent, Arville Lime

## 2022-10-22 NOTE — Op Note (Signed)
Operative Note  Preoperative diagnosis:  1.  BPH with bladder outlet obstruction  Postoperative diagnosis: 1.  BPH with bladder outlet obstruction  Procedure(s): 1.  Bipolar TURP  Surgeon: Ellison Hughs, MD  Assistants:  None  Anesthesia:  General  Complications:  None  EBL:  200 mL  Specimens: 1. Prostate chips  Drains/Catheters: 1.  24 F 3-way with 30 mL in the balloon  Intraoperative findings:   Tri-lobar prostatic urethral obstruction with prominent median and left lateral lobes Moderate to severe bladder trabeculation   Indication:  James Chavez is a 77 y.o. male with on-going LUTS secondary to BPH.  He has been consented for the above procedures, voices understanding and wishes to proceed.  Description of procedure:  After informed consent was obtained, the patient was brought to the operating room and general anesthesia was administered. The patient was then placed in the dorsolithotomy position and prepped and draped in usual sterile fashion. A timeout was performed. A 23 French rigid cystoscope was then inserted into the urethral meatus and advanced into the bladder under direct vision. A complete bladder survey revealed no intravesical pathology.  Both ureteral orifices were identified and well away from the bladder neck.  The rigid cystoscope was then exchanged for a 26 French resectoscope with a bipolar loop working element.  Starting at the bladder neck and progressing distally to the verumontanum, the prostatic adenoma was systematically resected until a widely patent prostatic urethral channel was created.  All prostate chips were then hand irrigated out of the bladder and sent to pathology for permanent section.  The resectoscope was then removed and exchanged for a 24 [place] three-way Foley catheter.  The three-way Foley catheter was then extensively hand irrigated until the irrigant returned clear to light pink.  The catheter was then placed to  continuous bladder irrigation and placed on rubber band traction.  He tolerated the procedure well and was transferred to the postanesthesia unit in stable condition.  Plan:  CBI overnight

## 2022-10-22 NOTE — Transfer of Care (Signed)
Immediate Anesthesia Transfer of Care Note  Patient: James Chavez  Procedure(s) Performed: BIPOLAR TRANSURETHRAL RESECTION OF THE PROSTATE (TURP) (Prostate)  Patient Location: PACU  Anesthesia Type:General  Level of Consciousness: drowsy and patient cooperative  Airway & Oxygen Therapy: Patient Spontanous Breathing  Post-op Assessment: Report given to RN and Post -op Vital signs reviewed and stable  Post vital signs: Reviewed and stable  Last Vitals:  Vitals Value Taken Time  BP 155/87 10/22/22 1240  Temp    Pulse 76 10/22/22 1241  Resp 23 10/22/22 1241  SpO2 96 % 10/22/22 1241  Vitals shown include unvalidated device data.  Last Pain:  Vitals:   10/22/22 0956  TempSrc:   PainSc: 0-No pain      Patients Stated Pain Goal: 7 (48/01/65 5374)  Complications: No notable events documented.

## 2022-10-22 NOTE — H&P (Signed)
PRE-OP H&P  Office Visit Report     10/09/2022   --------------------------------------------------------------------------------   James Chavez  MRN: [id number]  DOB: Sep 06, 1946, 77 year old Male  PRIMARY CARE:  Velna Hatchet, MD  PRIMARY CARE FAX:  [phone]  REFERRING:  Harrell Gave A. Lovena Neighbours, MD  PROVIDER:  Ellison Hughs, M.D.  SUPERVISING:  Festus Aloe, M.D.  TREATING:  Ashok Norris, PA-C  LOCATION:  [facility] [phone]     --------------------------------------------------------------------------------   CC/HPI: CC: Enlarged prostate   HPI: Mr. Heinze is a 77 year-old male with a history of BPH (previously treated with Alfuzosin) and erectile dysfunction (previously treated with Cialis)   Last PSA-2.3 (02/2021), 1.85 (2020), 1.95 (06/2018), 1.59 (06/11/2016). His prior PSA values were in the 1.0-1.5 range.   06/28/18: Today, the patient reports that he has been off of his Alfuzosin for the past several weeks and has noticed a weakening in his force of stream as well as increased nocturia. He feels like he is emptying his bladder well denies dysuria, gross hematuria or interval urinary tract infections. He is interested in restarting Alfuzosin.   For erectile dysfunction standpoint, he reports that he takes Cialis 5 mg sporadically with a good result. He denies any associated side effects.   07/01/19: Doing well with alfuzosin. He reports an adequate FOS and feels like he is emptying well. Nocturia x1-2. Denies interval UTIs, dysuria or hematuria. UA is clear today. He sporadically uses Cialis 5 mg PRN with a good result. Denies any associated side effects.   07/05/20: The patient is here today for a routine follow-up. He continues to take alfuzosin once daily. He reports some hesitancy during the 1st void in the morning and states that he will have to void 2-3 times to feel like he adequately empties his bladder. He has minimal issues  throughout the remainder of the day. Continues to have nocturia x1-2, depending on his fluid intake in the evenings. He admits to drinking several large cups of coffee throughout the day. Overall, he states that he has an adequate force of stream and feels like he is emptying his bladder well. He denies interval UTIs, dysuria or hematuria. He continues to use Cialis 5 mg with good erectile quality and minimal side effects.   08/01/21: The patient is here today for a routine follow-up. He was stated on alfuzosin BID at his last OV. He reports an adequate FOS and feels like he is emptying his bladder well. nocturia x 1-2-- he states he rarely has nights with 4-5 episodes. Denies interval UTIs, dysuria or hematuria. UA today is clear. He continues to use tadalafil 5 mg as needed and reports good erectile quality without any associated side effects.   08/07/2022: The patient is here today for routine follow-up. He continues to take alfuzosin twice daily and reports somewhat stable LUTS. He continues to struggle with nocturia x4-5 along with intermittent episodes of a weak force of stream. He admits that he sometimes has to Crede in order to initiate his stream. He denies interval UTIs, dysuria or hematuria. No new health issues. He states that is most recent PSA was WNL.   10/09/2022:  Patient presents for pre surgical appointment for upcoming TURP on 10/22/2022. He continues on alfuzosin, and endorses being at current baseline voiding function. He denies interim novel health diagnoses or concerns, new medications. He inquires about next steps, and has many questions with regards to foley catherization as he has never experienced it. He  also inquires about recovery time frame and peri-op details.     ALLERGIES: No Allergies    MEDICATIONS: Alfuzosin Hcl Er 10 mg tablet, extended release 24 hr 1 tablet PO BID  Claritin 10 mg tablet 0 Oral  Dymista 137 mcg-50 mcg/spray aerosol, spray with pump Nasal  Fish  Oil CAPS 0 Oral  Fluoxetine Hcl 20 mg capsule Oral  Fluticasone Propionate 50 mcg/actuation spray, suspension Nasal  Multivitamins tablet 1 Oral Daily  Qvar  Tadalafil 5 mg tablet Take 1 to 4 tablets PO PRN one hour prior to intercourse  Vitamin D3     GU PSH: Varicocele repair - 2010       [facility] Notes: Arthrotomy Of Knee With Open Meniscus Repair, Nasal Septal Deviation Repair, Surgery Spermatic Cord Ligation Of Spermatic Veins For Varicocele   NON-GU PSH: Repair Knee Cartilage - 2010     GU PMH: Incomplete bladder emptying - 08/28/2022, - 2017 Nocturia - 08/28/2022, (Worsening), - 2019, - 2017 Obstructive and reflux uropathy, Unspec - 08/28/2022, Obstructive uropathy, - 2014 BPH w/LUTS - 08/07/2022, - 08/01/2021, - 2021, - 2018, - 2017, Benign prostatic hyperplasia with urinary obstruction, - 2016 ED due to arterial insufficiency - 08/07/2022, - 08/01/2021, - 2021, - 2017, Erectile dysfunction due to arterial insufficiency, - 2016 Weak Urinary Stream - 08/07/2022, - 08/01/2021, - 2021, Weak urinary stream, - 2016      PMH Notes:  2010-02-21 15:57:49 - Note: Neoplasm Of The Prostate Gland   NON-GU PMH: Encounter for general adult medical examination without abnormal findings, Encounter for preventive health examination - 2016 Decreased libido, Libido, Decreased - 2014 Personal history of other endocrine, nutritional and metabolic disease, History of hypercholesterolemia - 2014 Personal history of other mental and behavioral disorders, History of depression - 2014    Immunizations: None   FAMILY HISTORY: Death In The Family Mother - Runs In Family Family Health Status - Father alive at age 27 - 75 In Family Hematuria - Father Hypertension - Father   SOCIAL HISTORY: Marital Status: Married Preferred Language: English; Race: White Current Smoking Status: Patient has never smoked.  Social Drinker.  Drinks 3 caffeinated drinks per day. Patient's occupation is/was Retired.     REVIEW OF SYSTEMS:    GU Review Male:   Patient denies frequent urination, hard to postpone urination, burning/ pain with urination, get up at night to urinate, leakage of urine, stream starts and stops, trouble starting your stream, have to strain to urinate , erection problems, and penile pain.  Gastrointestinal (Upper):   Patient denies nausea, vomiting, and indigestion/ heartburn.  Gastrointestinal (Lower):   Patient denies diarrhea and constipation.  Constitutional:   Patient denies fever, night sweats, weight loss, and fatigue.  Skin:   Patient denies skin rash/ lesion and itching.  Eyes:   Patient denies blurred vision and double vision.  Ears/ Nose/ Throat:   Patient denies sore throat and sinus problems.  Hematologic/Lymphatic:   Patient denies swollen glands and easy bruising.  Cardiovascular:   Patient denies leg swelling and chest pains.  Respiratory:   Patient denies cough and shortness of breath.  Endocrine:   Patient denies excessive thirst.  Musculoskeletal:   Patient denies back pain and joint pain.  Neurological:   Patient denies headaches and dizziness.  Psychologic:   Patient denies depression and anxiety.   VITAL SIGNS:      10/09/2022 02:38 PM  BP 120/68 mmHg  Pulse 66 /min  Temperature 97.5 F / 36.3 C   MULTI-SYSTEM  PHYSICAL EXAMINATION:    Constitutional: Well-nourished. No physical deformities. Normally developed. Good grooming.  Neck: Neck symmetrical, not swollen. Normal tracheal position.  Respiratory: No labored breathing, no use of accessory muscles. Lungs CTAB.  Cardiovascular: Normal temperature, normal extremity pulses, no swelling. No murmurs, rubs, or gallops. Regular rate and rhythm.  Skin: No paleness, no jaundice, no cyanosis.  Neurologic / Psychiatric: Oriented to time, oriented to place, oriented to person. No depression, no anxiety, no agitation.  Musculoskeletal: Normal gait and station of head and neck.     Complexity of Data:  Source Of  History:  Patient, Medical Record Summary  Records Review:   Previous Doctor Records, Previous Patient Records  Urine Test Review:   Urinalysis, Urine Culture   03/13/21 07/01/19 06/28/18 06/11/16 03/18/13 03/01/12 02/28/11 02/18/10  PSA  Total PSA 2.31 ng/ml 1.85 ng/mL 1.95 ng/mL 1.59 ng/dl 1.15  1.30  0.95  1.12     11/07/08  Hormones  Testosterone, Total 491.0     10/09/22  Urinalysis  Urine Appearance Clear   Urine Color Amber   Urine Glucose Neg mg/dL  Urine Bilirubin Neg mg/dL  Urine Ketones Neg mg/dL  Urine Specific Gravity 1.025   Urine Blood Neg ery/uL  Urine pH 6.0   Urine Protein Trace mg/dL  Urine Urobilinogen 0.2 mg/dL  Urine Nitrites Neg   Urine Leukocyte Esterase Neg leu/uL   PROCEDURES:          Urinalysis Dipstick Dipstick Cont'd  Color: Amber Bilirubin: Neg mg/dL  Appearance: Clear Ketones: Neg mg/dL  Specific Gravity: 1.025 Blood: Neg ery/uL  pH: 6.0 Protein: Trace mg/dL  Glucose: Neg mg/dL Urobilinogen: 0.2 mg/dL    Nitrites: Neg    Leukocyte Esterase: Neg leu/uL    ASSESSMENT:      ICD-10 Details  1 GU:   BPH w/LUTS - N40.1 Chronic, Stable  2   Nocturia - R35.1 Chronic, Worsening  3   Weak Urinary Stream - R39.12 Chronic, Worsening   PLAN:           Orders Labs PSA with Reflex, Urine Culture          Schedule Return Visit/Planned Activity: Keep Scheduled Appointment          Document Letter(s):  Created for Patient: Clinical Summary         Notes:   Today, UA WNL. We will send for culture for pre-surgical clearance. We will also follow up as appropriate with any positive results and for targeted therapy based on C&S report. We requested a PSA today as well. Patient is at baseline voiding function. No new health or urological concerns noted, nor new medications.   We discussed in detail what to expect with foley catherization, and management thereof. Patient will return with this extender 5 days post-op for TOV.   We counseled patient  that the 1st month s/p TURP may be challenging with respect to presence of intermittent dysuria, gross hematuria, possible passage of tissue/clot material, increased frequency, urgency, nocturia, and possibly some minor incontinence. Patient may become initially discouraged, but he will begin to note improvement after several weeks, with reaching new baseline typically in 3 months. Increased frequency and nocturia are classically the last symptoms to resolve, and may take 2-3 months. Patient voiced understanding.   Patient knows to advise this office in the interim of new health concerns, diagnoses, or medications. Keep f/u surgical appointment. Patient is amenable to this plan.   -The risks, benefits and alternatives of cystoscopy with  TURP was discussed with the patient.  The risks included, but are not limited to, bleeding,  urinary tract infection, bladder perforation requiring prolonged catheterization and/or open bladder repair, ureteral injury, ureteral obstruction, urethral stricture disease, new or worsening voiding dysfunction, retrograde ejaculation, MI, CVA, PE, DVT and the inherent risks of general anesthesia.  We also discussed the need for Foley catheterization for at least 3 days post-op and the likely need for post-op observation in the hospital following the procedure.  The patient voices understanding and wishes to proceed.

## 2022-10-22 NOTE — Anesthesia Postprocedure Evaluation (Signed)
Anesthesia Post Note  Patient: James Chavez  Procedure(s) Performed: BIPOLAR TRANSURETHRAL RESECTION OF THE PROSTATE (TURP) (Prostate)     Patient location during evaluation: PACU Anesthesia Type: General Level of consciousness: awake and alert Pain management: pain level controlled Vital Signs Assessment: post-procedure vital signs reviewed and stable Respiratory status: spontaneous breathing, nonlabored ventilation, respiratory function stable and patient connected to nasal cannula oxygen Cardiovascular status: blood pressure returned to baseline and stable Postop Assessment: no apparent nausea or vomiting Anesthetic complications: no  No notable events documented.  Last Vitals:  Vitals:   10/22/22 1316 10/22/22 1330  BP: (!) 141/84 (!) 145/87  Pulse: 72 71  Resp: 16 16  Temp:  36.5 C  SpO2: 93% 95%    Last Pain:  Vitals:   10/22/22 1430  TempSrc:   PainSc: 7                  Barnet Glasgow

## 2022-10-23 ENCOUNTER — Encounter (HOSPITAL_BASED_OUTPATIENT_CLINIC_OR_DEPARTMENT_OTHER): Payer: Self-pay | Admitting: Urology

## 2022-10-23 DIAGNOSIS — N401 Enlarged prostate with lower urinary tract symptoms: Secondary | ICD-10-CM | POA: Diagnosis not present

## 2022-10-23 DIAGNOSIS — R351 Nocturia: Secondary | ICD-10-CM | POA: Diagnosis not present

## 2022-10-23 DIAGNOSIS — N320 Bladder-neck obstruction: Secondary | ICD-10-CM | POA: Diagnosis not present

## 2022-10-23 DIAGNOSIS — R3912 Poor urinary stream: Secondary | ICD-10-CM | POA: Diagnosis not present

## 2022-10-23 DIAGNOSIS — Z79899 Other long term (current) drug therapy: Secondary | ICD-10-CM | POA: Diagnosis not present

## 2022-10-23 LAB — BASIC METABOLIC PANEL
Anion gap: 4 — ABNORMAL LOW (ref 5–15)
BUN: 19 mg/dL (ref 8–23)
CO2: 24 mmol/L (ref 22–32)
Calcium: 7.8 mg/dL — ABNORMAL LOW (ref 8.9–10.3)
Chloride: 109 mmol/L (ref 98–111)
Creatinine, Ser: 0.76 mg/dL (ref 0.61–1.24)
GFR, Estimated: >60 mL/min (ref >60)
Glucose, Bld: 99 mg/dL (ref 70–99)
Potassium: 4 mmol/L (ref 3.5–5.1)
Sodium: 137 mmol/L (ref 135–145)

## 2022-10-23 LAB — HEMOGLOBIN AND HEMATOCRIT, BLOOD
HCT: 37.9 % — ABNORMAL LOW (ref 39.0–52.0)
Hemoglobin: 12.7 g/dL — ABNORMAL LOW (ref 13.0–17.0)

## 2022-10-23 LAB — SURGICAL PATHOLOGY

## 2022-10-23 MED ORDER — ACETAMINOPHEN 325 MG PO TABS
ORAL_TABLET | ORAL | Status: AC
  Filled 2022-10-23: qty 2

## 2022-10-23 MED ORDER — CEFAZOLIN SODIUM-DEXTROSE 1-4 GM/50ML-% IV SOLN
INTRAVENOUS | Status: AC
  Filled 2022-10-23: qty 50

## 2022-10-23 NOTE — Discharge Summary (Signed)
Date of admission: 10/22/2022  Date of discharge: 10/23/2022  Admission diagnosis: BPH with LUTS  Discharge diagnosis: Same  Procedures: Bipolar TURP  History and Physical: For full details, please see admission history and physical. Briefly, James Chavez is a 77 y.o. year old patient with ongoing LUTS secondary to BPH.   He is s/p TURP on 10/22/22.   Hospital Course: Routine post-op course following TURP  Physical Exam:  General: Alert and oriented CV: RRR, palpable distal pulses Lungs: CTAB, equal chest rise Abdomen: Soft, NTND, no rebound or guarding GU:  24 F 3-way in place and draining link pink urine without CBI Ext: NT, No erythema  Laboratory values:  Recent Labs    10/22/22 1532 10/23/22 0402  HGB 14.0 12.7*  HCT 41.9 37.9*   Recent Labs    10/22/22 1532 10/23/22 0402  CREATININE 0.75 0.76    Disposition: Home  Discharge instruction: The patient was instructed to be ambulatory but told to refrain from heavy lifting, strenuous activity, or driving.  Discharge medications:  Allergies as of 10/23/2022   No Known Allergies      Medication List     TAKE these medications    azelastine 0.1 % nasal spray Commonly known as: ASTELIN Place 1 spray into both nostrils 2 (two) times daily. Use in each nostril as directed   CENTRUM SILVER ULTRA MENS PO Take by mouth once.   cephALEXin 500 MG capsule Commonly known as: KEFLEX Take 1 capsule (500 mg total) by mouth 2 (two) times daily for 3 days. Start taking on 10/26/22   Cialis 5 MG tablet Generic drug: tadalafil Take 5 mg by mouth daily.   Dexilant 60 MG capsule Generic drug: dexlansoprazole Take 60 mg by mouth daily.   Fish Oil 1200 MG Caps Take by mouth.   FLUoxetine 20 MG capsule Commonly known as: PROZAC Take 20 mg by mouth daily.   fluticasone 27.5 MCG/SPRAY nasal spray Commonly known as: VERAMYST Place 1 spray into the nose daily.   oxybutynin 5 MG tablet Commonly known as:  DITROPAN Take 1 tablet (5 mg total) by mouth every 8 (eight) hours as needed for bladder spasms.   phenazopyridine 200 MG tablet Commonly known as: Pyridium Take 1 tablet (200 mg total) by mouth 3 (three) times daily as needed (for pain with urination).   Qvar RediHaler 80 MCG/ACT inhaler Generic drug: beclomethasone   silodosin 8 MG Caps capsule Commonly known as: RAPAFLO Take by mouth daily.   traMADol 50 MG tablet Commonly known as: Ultram Take 1 tablet (50 mg total) by mouth every 6 (six) hours as needed for up to 5 days.   Vitamin D3 50 MCG (2000 UT) Tabs Take 2,000 Units by mouth once.        Followup:   Follow-up Information     [facility] Follow up on 10/27/2022.   Why: Catheter removal at [place] information: [place] [phone]

## 2022-11-24 DIAGNOSIS — R3912 Poor urinary stream: Secondary | ICD-10-CM | POA: Diagnosis not present

## 2022-11-24 DIAGNOSIS — R351 Nocturia: Secondary | ICD-10-CM | POA: Diagnosis not present

## 2022-11-24 DIAGNOSIS — R3914 Feeling of incomplete bladder emptying: Secondary | ICD-10-CM | POA: Diagnosis not present

## 2022-11-24 DIAGNOSIS — N401 Enlarged prostate with lower urinary tract symptoms: Secondary | ICD-10-CM | POA: Diagnosis not present

## 2023-01-01 DIAGNOSIS — J45990 Exercise induced bronchospasm: Secondary | ICD-10-CM | POA: Diagnosis not present

## 2023-01-01 DIAGNOSIS — K219 Gastro-esophageal reflux disease without esophagitis: Secondary | ICD-10-CM | POA: Diagnosis not present

## 2023-01-01 DIAGNOSIS — J300 Vasomotor rhinitis: Secondary | ICD-10-CM | POA: Diagnosis not present

## 2023-01-01 DIAGNOSIS — H1045 Other chronic allergic conjunctivitis: Secondary | ICD-10-CM | POA: Diagnosis not present

## 2023-01-22 DIAGNOSIS — R3912 Poor urinary stream: Secondary | ICD-10-CM | POA: Diagnosis not present

## 2023-01-22 DIAGNOSIS — N401 Enlarged prostate with lower urinary tract symptoms: Secondary | ICD-10-CM | POA: Diagnosis not present

## 2023-02-23 DIAGNOSIS — R509 Fever, unspecified: Secondary | ICD-10-CM | POA: Diagnosis not present

## 2023-02-23 DIAGNOSIS — R059 Cough, unspecified: Secondary | ICD-10-CM | POA: Diagnosis not present

## 2023-02-23 DIAGNOSIS — Z20822 Contact with and (suspected) exposure to covid-19: Secondary | ICD-10-CM | POA: Diagnosis not present

## 2023-02-23 DIAGNOSIS — B349 Viral infection, unspecified: Secondary | ICD-10-CM | POA: Diagnosis not present

## 2023-03-05 DIAGNOSIS — R058 Other specified cough: Secondary | ICD-10-CM | POA: Diagnosis not present

## 2023-03-05 DIAGNOSIS — J45909 Unspecified asthma, uncomplicated: Secondary | ICD-10-CM | POA: Diagnosis not present

## 2023-03-05 DIAGNOSIS — J0191 Acute recurrent sinusitis, unspecified: Secondary | ICD-10-CM | POA: Diagnosis not present

## 2023-04-07 DIAGNOSIS — R7989 Other specified abnormal findings of blood chemistry: Secondary | ICD-10-CM | POA: Diagnosis not present

## 2023-04-07 DIAGNOSIS — E559 Vitamin D deficiency, unspecified: Secondary | ICD-10-CM | POA: Diagnosis not present

## 2023-04-07 DIAGNOSIS — E785 Hyperlipidemia, unspecified: Secondary | ICD-10-CM | POA: Diagnosis not present

## 2023-04-07 DIAGNOSIS — E611 Iron deficiency: Secondary | ICD-10-CM | POA: Diagnosis not present

## 2023-04-07 DIAGNOSIS — Z125 Encounter for screening for malignant neoplasm of prostate: Secondary | ICD-10-CM | POA: Diagnosis not present

## 2023-04-07 DIAGNOSIS — K219 Gastro-esophageal reflux disease without esophagitis: Secondary | ICD-10-CM | POA: Diagnosis not present

## 2023-04-07 DIAGNOSIS — D649 Anemia, unspecified: Secondary | ICD-10-CM | POA: Diagnosis not present

## 2023-04-14 DIAGNOSIS — N401 Enlarged prostate with lower urinary tract symptoms: Secondary | ICD-10-CM | POA: Diagnosis not present

## 2023-04-14 DIAGNOSIS — Z1339 Encounter for screening examination for other mental health and behavioral disorders: Secondary | ICD-10-CM | POA: Diagnosis not present

## 2023-04-14 DIAGNOSIS — J300 Vasomotor rhinitis: Secondary | ICD-10-CM | POA: Diagnosis not present

## 2023-04-14 DIAGNOSIS — E781 Pure hyperglyceridemia: Secondary | ICD-10-CM | POA: Diagnosis not present

## 2023-04-14 DIAGNOSIS — Z1331 Encounter for screening for depression: Secondary | ICD-10-CM | POA: Diagnosis not present

## 2023-04-14 DIAGNOSIS — R251 Tremor, unspecified: Secondary | ICD-10-CM | POA: Diagnosis not present

## 2023-04-14 DIAGNOSIS — Z0000 Encounter for general adult medical examination without abnormal findings: Secondary | ICD-10-CM | POA: Diagnosis not present

## 2023-04-14 DIAGNOSIS — M19041 Primary osteoarthritis, right hand: Secondary | ICD-10-CM | POA: Diagnosis not present

## 2023-04-20 DIAGNOSIS — R3912 Poor urinary stream: Secondary | ICD-10-CM | POA: Diagnosis not present

## 2023-04-20 DIAGNOSIS — N401 Enlarged prostate with lower urinary tract symptoms: Secondary | ICD-10-CM | POA: Diagnosis not present

## 2023-08-01 DIAGNOSIS — Z23 Encounter for immunization: Secondary | ICD-10-CM | POA: Diagnosis not present

## 2023-09-07 DIAGNOSIS — H2513 Age-related nuclear cataract, bilateral: Secondary | ICD-10-CM | POA: Diagnosis not present

## 2023-09-07 DIAGNOSIS — H5213 Myopia, bilateral: Secondary | ICD-10-CM | POA: Diagnosis not present

## 2023-09-07 DIAGNOSIS — Z135 Encounter for screening for eye and ear disorders: Secondary | ICD-10-CM | POA: Diagnosis not present

## 2023-09-07 DIAGNOSIS — H524 Presbyopia: Secondary | ICD-10-CM | POA: Diagnosis not present

## 2023-09-07 DIAGNOSIS — H52223 Regular astigmatism, bilateral: Secondary | ICD-10-CM | POA: Diagnosis not present

## 2023-09-07 DIAGNOSIS — H04123 Dry eye syndrome of bilateral lacrimal glands: Secondary | ICD-10-CM | POA: Diagnosis not present

## 2023-10-26 DIAGNOSIS — D225 Melanocytic nevi of trunk: Secondary | ICD-10-CM | POA: Diagnosis not present

## 2023-10-26 DIAGNOSIS — L570 Actinic keratosis: Secondary | ICD-10-CM | POA: Diagnosis not present

## 2023-10-26 DIAGNOSIS — L821 Other seborrheic keratosis: Secondary | ICD-10-CM | POA: Diagnosis not present

## 2023-10-26 DIAGNOSIS — L814 Other melanin hyperpigmentation: Secondary | ICD-10-CM | POA: Diagnosis not present

## 2023-10-26 DIAGNOSIS — D2262 Melanocytic nevi of left upper limb, including shoulder: Secondary | ICD-10-CM | POA: Diagnosis not present

## 2023-12-22 DIAGNOSIS — H2513 Age-related nuclear cataract, bilateral: Secondary | ICD-10-CM | POA: Diagnosis not present

## 2023-12-22 DIAGNOSIS — H25043 Posterior subcapsular polar age-related cataract, bilateral: Secondary | ICD-10-CM | POA: Diagnosis not present

## 2023-12-22 DIAGNOSIS — H25013 Cortical age-related cataract, bilateral: Secondary | ICD-10-CM | POA: Diagnosis not present

## 2023-12-22 DIAGNOSIS — H2512 Age-related nuclear cataract, left eye: Secondary | ICD-10-CM | POA: Diagnosis not present

## 2023-12-22 DIAGNOSIS — H18413 Arcus senilis, bilateral: Secondary | ICD-10-CM | POA: Diagnosis not present

## 2024-01-14 DIAGNOSIS — K219 Gastro-esophageal reflux disease without esophagitis: Secondary | ICD-10-CM | POA: Diagnosis not present

## 2024-01-14 DIAGNOSIS — J300 Vasomotor rhinitis: Secondary | ICD-10-CM | POA: Diagnosis not present

## 2024-01-14 DIAGNOSIS — H1045 Other chronic allergic conjunctivitis: Secondary | ICD-10-CM | POA: Diagnosis not present

## 2024-01-14 DIAGNOSIS — J45990 Exercise induced bronchospasm: Secondary | ICD-10-CM | POA: Diagnosis not present

## 2024-03-16 DIAGNOSIS — H2512 Age-related nuclear cataract, left eye: Secondary | ICD-10-CM | POA: Diagnosis not present

## 2024-03-17 DIAGNOSIS — H2511 Age-related nuclear cataract, right eye: Secondary | ICD-10-CM | POA: Diagnosis not present

## 2024-04-11 DIAGNOSIS — E781 Pure hyperglyceridemia: Secondary | ICD-10-CM | POA: Diagnosis not present

## 2024-04-11 DIAGNOSIS — E611 Iron deficiency: Secondary | ICD-10-CM | POA: Diagnosis not present

## 2024-04-11 DIAGNOSIS — E559 Vitamin D deficiency, unspecified: Secondary | ICD-10-CM | POA: Diagnosis not present

## 2024-04-11 DIAGNOSIS — N401 Enlarged prostate with lower urinary tract symptoms: Secondary | ICD-10-CM | POA: Diagnosis not present

## 2024-04-11 DIAGNOSIS — D509 Iron deficiency anemia, unspecified: Secondary | ICD-10-CM | POA: Diagnosis not present

## 2024-04-11 DIAGNOSIS — E785 Hyperlipidemia, unspecified: Secondary | ICD-10-CM | POA: Diagnosis not present

## 2024-04-13 DIAGNOSIS — H2511 Age-related nuclear cataract, right eye: Secondary | ICD-10-CM | POA: Diagnosis not present

## 2024-04-18 DIAGNOSIS — Z1331 Encounter for screening for depression: Secondary | ICD-10-CM | POA: Diagnosis not present

## 2024-04-18 DIAGNOSIS — Z1339 Encounter for screening examination for other mental health and behavioral disorders: Secondary | ICD-10-CM | POA: Diagnosis not present

## 2024-04-18 DIAGNOSIS — E781 Pure hyperglyceridemia: Secondary | ICD-10-CM | POA: Diagnosis not present

## 2024-04-18 DIAGNOSIS — M25552 Pain in left hip: Secondary | ICD-10-CM | POA: Diagnosis not present

## 2024-04-18 DIAGNOSIS — M19041 Primary osteoarthritis, right hand: Secondary | ICD-10-CM | POA: Diagnosis not present

## 2024-04-18 DIAGNOSIS — E559 Vitamin D deficiency, unspecified: Secondary | ICD-10-CM | POA: Diagnosis not present

## 2024-04-18 DIAGNOSIS — Z0000 Encounter for general adult medical examination without abnormal findings: Secondary | ICD-10-CM | POA: Diagnosis not present

## 2024-04-18 DIAGNOSIS — E611 Iron deficiency: Secondary | ICD-10-CM | POA: Diagnosis not present

## 2024-04-18 DIAGNOSIS — N401 Enlarged prostate with lower urinary tract symptoms: Secondary | ICD-10-CM | POA: Diagnosis not present

## 2024-04-18 DIAGNOSIS — R251 Tremor, unspecified: Secondary | ICD-10-CM | POA: Diagnosis not present

## 2024-04-18 DIAGNOSIS — J300 Vasomotor rhinitis: Secondary | ICD-10-CM | POA: Diagnosis not present

## 2024-04-29 DIAGNOSIS — R3912 Poor urinary stream: Secondary | ICD-10-CM | POA: Diagnosis not present

## 2024-04-29 DIAGNOSIS — N3000 Acute cystitis without hematuria: Secondary | ICD-10-CM | POA: Diagnosis not present

## 2024-04-29 DIAGNOSIS — N401 Enlarged prostate with lower urinary tract symptoms: Secondary | ICD-10-CM | POA: Diagnosis not present

## 2024-05-30 DIAGNOSIS — L249 Irritant contact dermatitis, unspecified cause: Secondary | ICD-10-CM | POA: Diagnosis not present

## 2024-05-30 DIAGNOSIS — R21 Rash and other nonspecific skin eruption: Secondary | ICD-10-CM | POA: Diagnosis not present

## 2024-05-30 DIAGNOSIS — M1990 Unspecified osteoarthritis, unspecified site: Secondary | ICD-10-CM | POA: Diagnosis not present

## 2024-06-02 DIAGNOSIS — N3000 Acute cystitis without hematuria: Secondary | ICD-10-CM | POA: Diagnosis not present

## 2024-07-21 DIAGNOSIS — N400 Enlarged prostate without lower urinary tract symptoms: Secondary | ICD-10-CM | POA: Diagnosis not present

## 2024-07-21 DIAGNOSIS — R351 Nocturia: Secondary | ICD-10-CM | POA: Diagnosis not present

## 2024-07-21 DIAGNOSIS — N401 Enlarged prostate with lower urinary tract symptoms: Secondary | ICD-10-CM | POA: Diagnosis not present

## 2024-07-21 DIAGNOSIS — R399 Unspecified symptoms and signs involving the genitourinary system: Secondary | ICD-10-CM | POA: Diagnosis not present

## 2024-07-23 DIAGNOSIS — Z23 Encounter for immunization: Secondary | ICD-10-CM | POA: Diagnosis not present

## 2024-08-11 DIAGNOSIS — R509 Fever, unspecified: Secondary | ICD-10-CM | POA: Diagnosis not present

## 2024-08-11 DIAGNOSIS — R051 Acute cough: Secondary | ICD-10-CM | POA: Diagnosis not present

## 2024-08-11 DIAGNOSIS — R5383 Other fatigue: Secondary | ICD-10-CM | POA: Diagnosis not present

## 2024-08-11 DIAGNOSIS — Z1152 Encounter for screening for COVID-19: Secondary | ICD-10-CM | POA: Diagnosis not present

## 2024-08-11 DIAGNOSIS — J069 Acute upper respiratory infection, unspecified: Secondary | ICD-10-CM | POA: Diagnosis not present

## 2024-08-11 DIAGNOSIS — R0981 Nasal congestion: Secondary | ICD-10-CM | POA: Diagnosis not present

## 2024-08-11 DIAGNOSIS — J029 Acute pharyngitis, unspecified: Secondary | ICD-10-CM | POA: Diagnosis not present

## 2024-08-25 DIAGNOSIS — H5203 Hypermetropia, bilateral: Secondary | ICD-10-CM | POA: Diagnosis not present

## 2024-08-25 DIAGNOSIS — Z961 Presence of intraocular lens: Secondary | ICD-10-CM | POA: Diagnosis not present

## 2024-08-25 DIAGNOSIS — H52223 Regular astigmatism, bilateral: Secondary | ICD-10-CM | POA: Diagnosis not present

## 2024-08-25 DIAGNOSIS — H524 Presbyopia: Secondary | ICD-10-CM | POA: Diagnosis not present

## 2024-08-25 DIAGNOSIS — H04123 Dry eye syndrome of bilateral lacrimal glands: Secondary | ICD-10-CM | POA: Diagnosis not present

## 2024-09-21 DIAGNOSIS — J300 Vasomotor rhinitis: Secondary | ICD-10-CM | POA: Diagnosis not present

## 2024-09-21 DIAGNOSIS — H1045 Other chronic allergic conjunctivitis: Secondary | ICD-10-CM | POA: Diagnosis not present

## 2024-09-21 DIAGNOSIS — J45990 Exercise induced bronchospasm: Secondary | ICD-10-CM | POA: Diagnosis not present
